# Patient Record
Sex: Female | Born: 1980 | Race: Black or African American | Hispanic: No | Marital: Single | Smoking: Never smoker
Health system: Southern US, Community
[De-identification: ages and names within clinical notes are randomized; demographics above are authoritative.]

## PROBLEM LIST (undated history)

## (undated) DIAGNOSIS — E559 Vitamin D deficiency, unspecified: Secondary | ICD-10-CM

## (undated) DIAGNOSIS — E039 Hypothyroidism, unspecified: Secondary | ICD-10-CM

## (undated) DIAGNOSIS — Z9189 Other specified personal risk factors, not elsewhere classified: Secondary | ICD-10-CM

## (undated) DIAGNOSIS — E119 Type 2 diabetes mellitus without complications: Secondary | ICD-10-CM

## (undated) DIAGNOSIS — Z803 Family history of malignant neoplasm of breast: Secondary | ICD-10-CM

## (undated) DIAGNOSIS — E785 Hyperlipidemia, unspecified: Secondary | ICD-10-CM

## (undated) DIAGNOSIS — Z1371 Encounter for nonprocreative screening for genetic disease carrier status: Secondary | ICD-10-CM

## (undated) HISTORY — DX: Hyperlipidemia, unspecified: E78.5

## (undated) HISTORY — DX: Hypothyroidism, unspecified: E03.9

## (undated) HISTORY — DX: Encounter for nonprocreative screening for genetic disease carrier status: Z13.71

## (undated) HISTORY — PX: WISDOM TOOTH EXTRACTION: SHX21

## (undated) HISTORY — DX: Family history of malignant neoplasm of breast: Z80.3

## (undated) HISTORY — DX: Vitamin D deficiency, unspecified: E55.9

## (undated) HISTORY — DX: Other specified personal risk factors, not elsewhere classified: Z91.89

## (undated) HISTORY — DX: Type 2 diabetes mellitus without complications: E11.9

---

## 2010-02-22 ENCOUNTER — Ambulatory Visit: Payer: Self-pay | Admitting: Internal Medicine

## 2011-08-14 ENCOUNTER — Ambulatory Visit: Payer: Self-pay | Admitting: Internal Medicine

## 2014-10-01 ENCOUNTER — Other Ambulatory Visit: Payer: Self-pay | Admitting: Obstetrics and Gynecology

## 2014-10-01 DIAGNOSIS — Z1231 Encounter for screening mammogram for malignant neoplasm of breast: Secondary | ICD-10-CM

## 2014-10-04 ENCOUNTER — Ambulatory Visit
Admission: RE | Admit: 2014-10-04 | Discharge: 2014-10-04 | Disposition: A | Discharge status: Home / Self Care | Payer: BC Managed Care – PPO | Source: Ambulatory Visit | Attending: Obstetrics and Gynecology | Admitting: Obstetrics and Gynecology

## 2014-10-04 DIAGNOSIS — Z1231 Encounter for screening mammogram for malignant neoplasm of breast: Secondary | ICD-10-CM | POA: Insufficient documentation

## 2014-10-07 ENCOUNTER — Other Ambulatory Visit: Payer: Self-pay | Admitting: Obstetrics and Gynecology

## 2014-10-07 DIAGNOSIS — R928 Other abnormal and inconclusive findings on diagnostic imaging of breast: Secondary | ICD-10-CM

## 2014-10-07 DIAGNOSIS — N6489 Other specified disorders of breast: Secondary | ICD-10-CM

## 2014-10-20 ENCOUNTER — Ambulatory Visit
Admission: RE | Admit: 2014-10-20 | Discharge: 2014-10-20 | Disposition: A | Discharge status: Home / Self Care | Payer: BC Managed Care – PPO | Source: Ambulatory Visit | Attending: Obstetrics and Gynecology | Admitting: Obstetrics and Gynecology

## 2014-10-20 ENCOUNTER — Ambulatory Visit: Payer: BC Managed Care – PPO

## 2014-10-20 DIAGNOSIS — N6082 Other benign mammary dysplasias of left breast: Secondary | ICD-10-CM | POA: Insufficient documentation

## 2014-10-20 DIAGNOSIS — N6489 Other specified disorders of breast: Secondary | ICD-10-CM | POA: Diagnosis present

## 2014-10-20 DIAGNOSIS — R928 Other abnormal and inconclusive findings on diagnostic imaging of breast: Secondary | ICD-10-CM

## 2015-02-13 DIAGNOSIS — Z1371 Encounter for nonprocreative screening for genetic disease carrier status: Secondary | ICD-10-CM

## 2015-02-13 DIAGNOSIS — Z9189 Other specified personal risk factors, not elsewhere classified: Secondary | ICD-10-CM

## 2015-02-13 HISTORY — DX: Other specified personal risk factors, not elsewhere classified: Z91.89

## 2015-02-13 HISTORY — DX: Encounter for nonprocreative screening for genetic disease carrier status: Z13.71

## 2015-09-26 ENCOUNTER — Other Ambulatory Visit: Payer: Self-pay | Admitting: Obstetrics and Gynecology

## 2015-09-26 DIAGNOSIS — Z1231 Encounter for screening mammogram for malignant neoplasm of breast: Secondary | ICD-10-CM

## 2015-10-18 ENCOUNTER — Other Ambulatory Visit: Payer: Self-pay | Admitting: Obstetrics and Gynecology

## 2015-10-18 ENCOUNTER — Ambulatory Visit
Admission: RE | Admit: 2015-10-18 | Discharge: 2015-10-18 | Disposition: A | Discharge status: Home / Self Care | Payer: BC Managed Care – PPO | Source: Ambulatory Visit | Attending: Obstetrics and Gynecology | Admitting: Obstetrics and Gynecology

## 2015-10-18 DIAGNOSIS — R928 Other abnormal and inconclusive findings on diagnostic imaging of breast: Secondary | ICD-10-CM | POA: Diagnosis not present

## 2015-10-18 DIAGNOSIS — Z1231 Encounter for screening mammogram for malignant neoplasm of breast: Secondary | ICD-10-CM | POA: Insufficient documentation

## 2015-10-20 ENCOUNTER — Other Ambulatory Visit: Payer: Self-pay | Admitting: Obstetrics and Gynecology

## 2015-10-20 DIAGNOSIS — N6489 Other specified disorders of breast: Secondary | ICD-10-CM

## 2015-11-15 ENCOUNTER — Ambulatory Visit
Admission: RE | Admit: 2015-11-15 | Discharge: 2015-11-15 | Disposition: A | Discharge status: Home / Self Care | Payer: BC Managed Care – PPO | Source: Ambulatory Visit | Attending: Obstetrics and Gynecology | Admitting: Obstetrics and Gynecology

## 2015-11-15 DIAGNOSIS — N6489 Other specified disorders of breast: Secondary | ICD-10-CM

## 2016-05-08 ENCOUNTER — Other Ambulatory Visit: Payer: Self-pay | Admitting: Obstetrics and Gynecology

## 2016-07-09 ENCOUNTER — Other Ambulatory Visit: Payer: Self-pay | Admitting: Obstetrics and Gynecology

## 2016-07-30 ENCOUNTER — Other Ambulatory Visit: Payer: Self-pay | Admitting: Obstetrics and Gynecology

## 2016-08-03 ENCOUNTER — Other Ambulatory Visit: Payer: Self-pay | Admitting: Obstetrics and Gynecology

## 2016-08-06 MED ORDER — LEVOTHYROXINE SODIUM 100 MCG PO TABS: 100.0000 ug | ORAL_TABLET | Freq: Every day | ORAL | 0 refills | Status: DC

## 2016-08-17 ENCOUNTER — Telehealth: Payer: Self-pay

## 2016-08-17 DIAGNOSIS — E039 Hypothyroidism, unspecified: Secondary | ICD-10-CM

## 2016-08-17 DIAGNOSIS — E119 Type 2 diabetes mellitus without complications: Secondary | ICD-10-CM

## 2016-08-17 NOTE — Telephone Encounter (Signed)
Pt is not able to sched annual until around sometime in Sept but she will need a refill of her levothyroxine before then.  She was wanting to know if labs were still in for her.  Adv they are not in the new system.  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  Adv will send msg to [facility] who [facility]  [phone]

## 2016-08-20 DIAGNOSIS — E039 Hypothyroidism, unspecified: Secondary | ICD-10-CM | POA: Insufficient documentation

## 2016-08-20 DIAGNOSIS — E119 Type 2 diabetes mellitus without complications: Secondary | ICD-10-CM | POA: Insufficient documentation

## 2016-08-20 NOTE — Telephone Encounter (Signed)
Detailed msg left on VM.

## 2016-08-20 NOTE — Telephone Encounter (Signed)
RN to notify pt lab orders are in and I will f/u with results. Will RF levo Rx prn lab results.

## 2016-08-31 ENCOUNTER — Other Ambulatory Visit: Payer: BC Managed Care – PPO

## 2016-08-31 DIAGNOSIS — E039 Hypothyroidism, unspecified: Secondary | ICD-10-CM

## 2016-08-31 DIAGNOSIS — E119 Type 2 diabetes mellitus without complications: Secondary | ICD-10-CM

## 2016-09-01 LAB — COMPREHENSIVE METABOLIC PANEL
A/G RATIO: 1.2 (ref 1.2–2.2)
ALT: 18 IU/L (ref 0–32)
AST: 18 IU/L (ref 0–40)
Albumin: 4.3 g/dL (ref 3.5–5.5)
Alkaline Phosphatase: 66 IU/L (ref 39–117)
BILIRUBIN TOTAL: 0.2 mg/dL (ref 0.0–1.2)
BUN/Creatinine Ratio: 8 — ABNORMAL LOW (ref 9–23)
BUN: 7 mg/dL (ref 6–20)
CALCIUM: 9 mg/dL (ref 8.7–10.2)
CHLORIDE: 100 mmol/L (ref 96–106)
CO2: 22 mmol/L (ref 20–29)
Creatinine, Ser: 0.9 mg/dL (ref 0.57–1.00)
GFR, EST AFRICAN AMERICAN: 95 mL/min/{1.73_m2} (ref >59)
GFR, EST NON AFRICAN AMERICAN: 83 mL/min/{1.73_m2} (ref >59)
GLOBULIN, TOTAL: 3.6 g/dL (ref 1.5–4.5)
Glucose: 98 mg/dL (ref 65–99)
POTASSIUM: 4.2 mmol/L (ref 3.5–5.2)
SODIUM: 138 mmol/L (ref 134–144)
TOTAL PROTEIN: 7.9 g/dL (ref 6.0–8.5)

## 2016-09-01 LAB — HEMOGLOBIN A1C
Est. average glucose Bld gHb Est-mCnc: 137 mg/dL
Hgb A1c MFr Bld: 6.4 % — ABNORMAL HIGH (ref 4.8–5.6)

## 2016-09-01 LAB — TSH+FREE T4
Free T4: 1.53 ng/dL (ref 0.82–1.77)
TSH: 5.17 u[IU]/mL — ABNORMAL HIGH (ref 0.450–4.500)

## 2016-09-03 ENCOUNTER — Telehealth: Payer: Self-pay | Admitting: Obstetrics and Gynecology

## 2016-09-03 DIAGNOSIS — E039 Hypothyroidism, unspecified: Secondary | ICD-10-CM

## 2016-09-03 DIAGNOSIS — E119 Type 2 diabetes mellitus without complications: Secondary | ICD-10-CM

## 2016-09-03 MED ORDER — LISINOPRIL 5 MG PO TABS: 5.0000 mg | ORAL_TABLET | Freq: Every day | ORAL | 0 refills | Status: DC

## 2016-09-03 MED ORDER — LEVOTHYROXINE SODIUM 100 MCG PO TABS: 100.0000 ug | ORAL_TABLET | Freq: Every day | ORAL | 0 refills | Status: DC

## 2016-09-03 MED ORDER — METFORMIN HCL 500 MG PO TABS: 500.0000 mg | ORAL_TABLET | Freq: Two times a day (BID) | ORAL | 0 refills | Status: DC

## 2016-09-03 NOTE — Telephone Encounter (Signed)
Pt aware of labs for type 2 DM and hypothyroidism. Taking metformin 500 mg QD and sometimes BID (as prescribed) for type 2 DM. HgA1C stable compared to 11/17 labs. Rx RF.  Check urine at 9/18 annual. Rx RF lisinopril for kidney protection. Pt taking levo 100 mcg daily but feeling sluggish now. TSH increased from 3.75. Will add 2 tabs on Sundays. Rx RF. Rechk at 9/18 annual.

## 2016-10-25 ENCOUNTER — Other Ambulatory Visit: Payer: BC Managed Care – PPO

## 2016-10-25 DIAGNOSIS — E039 Hypothyroidism, unspecified: Secondary | ICD-10-CM

## 2016-10-25 DIAGNOSIS — E119 Type 2 diabetes mellitus without complications: Secondary | ICD-10-CM

## 2016-10-26 LAB — TSH+FREE T4
FREE T4: 1.81 ng/dL — AB (ref 0.82–1.77)
TSH: 0.557 u[IU]/mL (ref 0.450–4.500)

## 2016-10-26 LAB — MICROALBUMIN / CREATININE URINE RATIO
Creatinine, Urine: 133.9 mg/dL
MICROALBUM., U, RANDOM: 9.3 ug/mL
Microalb/Creat Ratio: 6.9 mg/g creat (ref 0.0–30.0)

## 2016-10-30 ENCOUNTER — Encounter: Payer: Self-pay | Admitting: Obstetrics and Gynecology

## 2016-10-30 ENCOUNTER — Ambulatory Visit (INDEPENDENT_AMBULATORY_CARE_PROVIDER_SITE_OTHER): Payer: BC Managed Care – PPO | Admitting: Obstetrics and Gynecology

## 2016-10-30 VITALS — BP 114/70 | HR 76 | Ht 66.0 in | Wt 192.0 lb

## 2016-10-30 DIAGNOSIS — Z9189 Other specified personal risk factors, not elsewhere classified: Secondary | ICD-10-CM | POA: Insufficient documentation

## 2016-10-30 DIAGNOSIS — Z0000 Encounter for general adult medical examination without abnormal findings: Secondary | ICD-10-CM | POA: Diagnosis not present

## 2016-10-30 DIAGNOSIS — Z01419 Encounter for gynecological examination (general) (routine) without abnormal findings: Secondary | ICD-10-CM

## 2016-10-30 DIAGNOSIS — Z1231 Encounter for screening mammogram for malignant neoplasm of breast: Secondary | ICD-10-CM | POA: Diagnosis not present

## 2016-10-30 DIAGNOSIS — E039 Hypothyroidism, unspecified: Secondary | ICD-10-CM | POA: Diagnosis not present

## 2016-10-30 DIAGNOSIS — Z113 Encounter for screening for infections with a predominantly sexual mode of transmission: Secondary | ICD-10-CM | POA: Diagnosis not present

## 2016-10-30 DIAGNOSIS — Z803 Family history of malignant neoplasm of breast: Secondary | ICD-10-CM

## 2016-10-30 DIAGNOSIS — E119 Type 2 diabetes mellitus without complications: Secondary | ICD-10-CM

## 2016-10-30 DIAGNOSIS — Z1239 Encounter for other screening for malignant neoplasm of breast: Secondary | ICD-10-CM

## 2016-10-30 MED ORDER — LEVOTHYROXINE SODIUM 100 MCG PO TABS: 100.0000 ug | ORAL_TABLET | Freq: Every day | ORAL | 0 refills | Status: DC

## 2016-10-30 MED ORDER — LISINOPRIL 5 MG PO TABS
5.0000 mg | ORAL_TABLET | Freq: Every day | ORAL | 0 refills | Status: DC
Start: 2016-10-30 — End: 2017-02-11

## 2016-10-30 MED ORDER — METFORMIN HCL 500 MG PO TABS: 500.0000 mg | ORAL_TABLET | Freq: Two times a day (BID) | ORAL | 0 refills | Status: DC

## 2016-10-30 NOTE — Progress Notes (Signed)
PCP:  Chad Cordial, PA-C   Chief Complaint  Patient presents with  . Gynecologic Exam     HPI:      [name] is a 36 y.o. No obstetric history on file. who LMP was Patient's last menstrual period was 10/22/2016., presents today for her annual examination.  Her menses are regular every 28-30 days, lasting 4 days.  Dysmenorrhea none. She does not have intermenstrual bleeding.  Sex activity: single partner, contraception - condoms. Declines any other BC. Wants full STD testing--no sx/exposures; just wants to be safe. Will wait for blood STD testing with other labs in 3 months.  Last Pap: 09/08/15  Results were: no abnormalities /neg HPV DNA  Hx of STDs: HPV  Last mammogram: November 15, 2015  Results were: normal--routine follow-up in 12 months There is a FH of breast cancer in her mom. Pt is MyRisk neg 2017. IBIS=29.6% . There is no FH of ovarian cancer. The patient does do self-breast exams. She is taking Vit D supp. She has not had a screening breast MRI.  Tobacco use: The patient denies current or previous tobacco use. Alcohol use: none No drug use.  Exercise: moderately active  She does get adequate calcium and Vitamin D in her diet.  She has a hx of type 2 DM and is on metformin 500 mg BID. HgA1C was 6.4 7/18. She is on lisinopril for kidney protection. Urine creatinine and albumin WNL 10/25/16. Lipids WNL 2017. She sees opthalmology yearly. No side effects from meds.   She has hypothyroidism and was taking levo 100 mcg daily till 7/18 labs. TSH was slighly elevated 7/18 with normal free T4. Due to fatigue, levo increased to 2 tabs on Sun. TSH WNL 10/25/16 but free T4 slightly elevated at 1.81.     Recent Results (from the past 2160 hour(s))  Comprehensive metabolic panel     Status: Abnormal   Collection Time: 08/31/16 11:06 AM  Result Value Ref Range   Glucose 98 65 - 99 mg/dL   BUN 7 6 - 20 mg/dL   Creatinine, Ser 0.90 0.57 - 1.00 mg/dL   GFR calc non Af  Amer 83 >59 mL/min/1.73   GFR calc Af Amer 95 >59 mL/min/1.73   BUN/Creatinine Ratio 8 (L) 9 - 23   Sodium 138 134 - 144 mmol/L   Potassium 4.2 3.5 - 5.2 mmol/L   Chloride 100 96 - 106 mmol/L   CO2 22 20 - 29 mmol/L   Calcium 9.0 8.7 - 10.2 mg/dL   Total Protein 7.9 6.0 - 8.5 g/dL   Albumin 4.3 3.5 - 5.5 g/dL   Globulin, Total 3.6 1.5 - 4.5 g/dL   Albumin/Globulin Ratio 1.2 1.2 - 2.2   Bilirubin Total 0.2 0.0 - 1.2 mg/dL   Alkaline Phosphatase 66 39 - 117 IU/L   AST 18 0 - 40 IU/L   ALT 18 0 - 32 IU/L  TSH + free T4     Status: Abnormal   Collection Time: 08/31/16 11:06 AM  Result Value Ref Range   TSH 5.170 (H) 0.450 - 4.500 uIU/mL   Free T4 1.53 0.82 - 1.77 ng/dL  Hemoglobin A1c     Status: Abnormal   Collection Time: 08/31/16 11:06 AM  Result Value Ref Range   Hgb A1c MFr Bld 6.4 (H) 4.8 - 5.6 %    Comment:          Pre-diabetes: 5.7 - 6.4  Diabetes: >6.4          Glycemic control for adults with diabetes: <7.0    Est. average glucose Bld gHb Est-mCnc 137 mg/dL  Urine Microalbumin w/creat. ratio     Status: None   Collection Time: 10/25/16  2:54 PM  Result Value Ref Range   Creatinine, Urine 133.9 Not Estab. mg/dL   Albumin, Urine 9.3 Not Estab. ug/mL   Microalb/Creat Ratio 6.9 0.0 - 30.0 mg/g creat  TSH + free T4     Status: Abnormal   Collection Time: 10/25/16  2:54 PM  Result Value Ref Range   TSH 0.557 0.450 - 4.500 uIU/mL   Free T4 1.81 (H) 0.82 - 1.77 ng/dL     Past Medical History:  Diagnosis Date  . BRCA negative 2017   MyRisk neg  . Family history of breast cancer   . Hypothyroid   . Increased risk of breast cancer 2017   IBIS=29.6%  . Type 2 diabetes mellitus ([place])   . Vitamin D deficiency     History reviewed. No pertinent surgical history.  Family History  Problem Relation Age of Onset  . Breast cancer Mother 74  . Kidney cancer Mother 40  . Diabetes type II Father   . Ovarian cancer Neg Hx   . Colon cancer Neg Hx     Social  History   Social History  . Marital status: Single    Spouse name: N/A  . Number of children: N/A  . Years of education: N/A   Occupational History  . Not on file.   Social History Main Topics  . Smoking status: Never Smoker  . Smokeless tobacco: Never Used  . Alcohol use No  . Drug use: No  . Sexual activity: Yes    Partners: Male    Birth control/ protection: None   Other Topics Concern  . Not on file   Social History Narrative  . No narrative on file    Current Meds  Medication Sig  . levothyroxine (SYNTHROID, LEVOTHROID) 100 MCG tablet Take 1 tablet (100 mcg total) by mouth daily. Except 2 tabs on Sundays  . lisinopril (PRINIVIL,ZESTRIL) 5 MG tablet Take 1 tablet (5 mg total) by mouth daily.  . metFORMIN (GLUCOPHAGE) 500 MG tablet Take 1 tablet (500 mg total) by mouth 2 (two) times daily with a meal.  . [DISCONTINUED] levothyroxine (SYNTHROID, LEVOTHROID) 100 MCG tablet Take 1 tablet (100 mcg total) by mouth daily. Except 2 tabs on Sundays  . [DISCONTINUED] lisinopril (PRINIVIL,ZESTRIL) 5 MG tablet Take 1 tablet (5 mg total) by mouth daily.  . [DISCONTINUED] metFORMIN (GLUCOPHAGE) 500 MG tablet Take 1 tablet (500 mg total) by mouth 2 (two) times daily with a meal.     ROS:  Review of Systems  Constitutional: Negative for fatigue, fever and unexpected weight change.  Respiratory: Negative for cough, shortness of breath and wheezing.   Cardiovascular: Negative for chest pain, palpitations and leg swelling.  Gastrointestinal: Negative for blood in stool, constipation, diarrhea, nausea and vomiting.  Endocrine: Negative for cold intolerance, heat intolerance and polyuria.  Genitourinary: Negative for dyspareunia, dysuria, flank pain, frequency, genital sores, hematuria, menstrual problem, pelvic pain, urgency, vaginal bleeding, vaginal discharge and vaginal pain.  Musculoskeletal: Positive for arthralgias. Negative for back pain, joint swelling and myalgias.  Skin:  Negative for rash.  Neurological: Negative for dizziness, syncope, light-headedness, numbness and headaches.  Hematological: Negative for adenopathy.  Psychiatric/Behavioral: Negative for agitation, confusion, sleep disturbance and suicidal ideas. The patient  is not nervous/anxious.      Objective: BP 114/70 (BP Location: Left Arm, Patient Position: Sitting, Cuff Size: Normal)   Pulse 76   Ht _0  (1.676 m)   Wt 192 lb (87.1 kg)   LMP 10/22/2016   BMI 30.99 kg/m    Physical Exam  Constitutional: She is oriented to person, place, and time. She appears well-developed and well-nourished.  Genitourinary: Vagina normal and uterus normal. There is no rash or tenderness on the right labia. There is no rash or tenderness on the left labia. No erythema or tenderness in the vagina. No vaginal discharge found. Right adnexum does not display mass and does not display tenderness. Left adnexum does not display mass and does not display tenderness. Cervix does not exhibit motion tenderness or polyp. Uterus is not enlarged or tender.  Neck: Normal range of motion. No thyromegaly present.  Cardiovascular: Normal rate, regular rhythm and normal heart sounds.   No murmur heard. Pulmonary/Chest: Effort normal and breath sounds normal. Right breast exhibits no mass, no nipple discharge, no skin change and no tenderness. Left breast exhibits no mass, no nipple discharge, no skin change and no tenderness.  Abdominal: Soft. There is no tenderness. There is no guarding.  Musculoskeletal: Normal range of motion.  Neurological: She is alert and oriented to person, place, and time. No cranial nerve deficit.  Psychiatric: She has a normal mood and affect. Her behavior is normal.  Vitals reviewed.   Assessment/Plan: Encounter for annual routine gynecological examination  Screening for STD (sexually transmitted disease) - Nuswab today. Blood tests with next lab draw in 3 months. - Plan: HIV antibody, RPR, HSV 2  antibody, IgG, Hepatitis C antibody, Chlamydia/Gonococcus/Trichomonas, NAA  Screening for breast cancer - Pt to sched mammo. - Plan: MM DIGITAL SCREENING BILATERAL  Family history of breast cancer  Increased risk of breast cancer - MyRisk neg 2017; IBIS=29.6%. Cont monthly SBE, Q6 mo CBE, yearly mammos and scr breast MRI. Pt to RTO in 6 mo. Can sched MRI then if pt interested.  Blood tests for routine general physical examination - Plan: Comprehensive metabolic panel, Lipid panel, TSH + free T4, Hemoglobin A1c  Type 2 diabetes mellitus without complication, without long-term current use of insulin ([facility]) - Rx RF metformin and lisinopril. Cont diet/exercise. REchk labs in 3 months. Seeing opthalmology yearly. - Plan: Comprehensive metabolic panel, Lipid panel, Hemoglobin A1c, metFORMIN (GLUCOPHAGE) 500 MG tablet, lisinopril (PRINIVIL,ZESTRIL) 5 MG tablet  Acquired hypothyroidism - Cont levo 100 mcg daily with 2 tabs on Sun. Rechk labs in 3 months. - Plan: levothyroxine (SYNTHROID, LEVOTHROID) 100 MCG tablet   Meds ordered this encounter  Medications  . metFORMIN (GLUCOPHAGE) 500 MG tablet    Sig: Take 1 tablet (500 mg total) by mouth 2 (two) times daily with a meal.    Dispense:  180 tablet    Refill:  0  . lisinopril (PRINIVIL,ZESTRIL) 5 MG tablet    Sig: Take 1 tablet (5 mg total) by mouth daily.    Dispense:  90 tablet    Refill:  0  . levothyroxine (SYNTHROID, LEVOTHROID) 100 MCG tablet    Sig: Take 1 tablet (100 mcg total) by mouth daily. Except 2 tabs on Sundays    Dispense:  96 tablet    Refill:  0             GYN counsel breast self exam, mammography screening, adequate intake of calcium and vitamin D, diet and exercise  F/U  Return in about 1 year (around 10/30/2017).  Marcina Kinnison B. Aydon Swamy, PA-C 10/31/2016 12:00 PM

## 2016-11-02 LAB — CHLAMYDIA/GONOCOCCUS/TRICHOMONAS, NAA
Chlamydia by NAA: NEGATIVE
Gonococcus by NAA: NEGATIVE
Trich vag by NAA: NEGATIVE

## 2016-11-18 ENCOUNTER — Other Ambulatory Visit: Payer: Self-pay | Admitting: Obstetrics and Gynecology

## 2016-11-18 DIAGNOSIS — E039 Hypothyroidism, unspecified: Secondary | ICD-10-CM

## 2017-02-06 ENCOUNTER — Other Ambulatory Visit: Payer: BC Managed Care – PPO

## 2017-02-06 DIAGNOSIS — Z113 Encounter for screening for infections with a predominantly sexual mode of transmission: Secondary | ICD-10-CM

## 2017-02-06 DIAGNOSIS — E119 Type 2 diabetes mellitus without complications: Secondary | ICD-10-CM

## 2017-02-06 DIAGNOSIS — Z0000 Encounter for general adult medical examination without abnormal findings: Secondary | ICD-10-CM

## 2017-02-07 ENCOUNTER — Telehealth: Payer: Self-pay | Admitting: Obstetrics and Gynecology

## 2017-02-07 ENCOUNTER — Other Ambulatory Visit: Payer: BC Managed Care – PPO

## 2017-02-07 NOTE — Telephone Encounter (Signed)
Pt is calling needing labs. Please advise no order in place.

## 2017-02-07 NOTE — Telephone Encounter (Signed)
Lab orders in per Ova FreshwaterJennifer Wilson. Pt sched for lab appt.

## 2017-02-08 ENCOUNTER — Other Ambulatory Visit: Payer: BC Managed Care – PPO

## 2017-02-09 LAB — LIPID PANEL
CHOLESTEROL TOTAL: 159 mg/dL (ref 100–199)
Chol/HDL Ratio: 3.2 ratio (ref 0.0–4.4)
HDL: 50 mg/dL (ref >39)
LDL Calculated: 86 mg/dL (ref 0–99)
TRIGLYCERIDES: 117 mg/dL (ref 0–149)
VLDL Cholesterol Cal: 23 mg/dL (ref 5–40)

## 2017-02-09 LAB — COMPREHENSIVE METABOLIC PANEL
A/G RATIO: 1.3 (ref 1.2–2.2)
ALK PHOS: 67 IU/L (ref 39–117)
ALT: 21 IU/L (ref 0–32)
AST: 17 IU/L (ref 0–40)
Albumin: 4.3 g/dL (ref 3.5–5.5)
BUN/Creatinine Ratio: 8 — ABNORMAL LOW (ref 9–23)
BUN: 7 mg/dL (ref 6–20)
Bilirubin Total: <0.2 mg/dL (ref 0.0–1.2)
CO2: 23 mmol/L (ref 20–29)
CREATININE: 0.93 mg/dL (ref 0.57–1.00)
Calcium: 8.8 mg/dL (ref 8.7–10.2)
Chloride: 100 mmol/L (ref 96–106)
GFR calc Af Amer: 91 mL/min/{1.73_m2} (ref >59)
GFR calc non Af Amer: 79 mL/min/{1.73_m2} (ref >59)
GLOBULIN, TOTAL: 3.4 g/dL (ref 1.5–4.5)
Glucose: 110 mg/dL — ABNORMAL HIGH (ref 65–99)
POTASSIUM: 4.2 mmol/L (ref 3.5–5.2)
SODIUM: 142 mmol/L (ref 134–144)
Total Protein: 7.7 g/dL (ref 6.0–8.5)

## 2017-02-09 LAB — TSH+FREE T4
Free T4: 1.73 ng/dL (ref 0.82–1.77)
TSH: 0.744 u[IU]/mL (ref 0.450–4.500)

## 2017-02-09 LAB — RPR: RPR: NONREACTIVE

## 2017-02-09 LAB — HEPATITIS C ANTIBODY: Hep C Virus Ab: <0.1 s/co ratio (ref 0.0–0.9)

## 2017-02-09 LAB — HEMOGLOBIN A1C
ESTIMATED AVERAGE GLUCOSE: 146 mg/dL
HEMOGLOBIN A1C: 6.7 % — AB (ref 4.8–5.6)

## 2017-02-09 LAB — HSV 2 ANTIBODY, IGG

## 2017-02-09 LAB — HIV ANTIBODY (ROUTINE TESTING W REFLEX): HIV SCREEN 4TH GENERATION: NONREACTIVE

## 2017-02-11 ENCOUNTER — Telehealth: Payer: Self-pay | Admitting: Obstetrics and Gynecology

## 2017-02-11 DIAGNOSIS — E119 Type 2 diabetes mellitus without complications: Secondary | ICD-10-CM

## 2017-02-11 DIAGNOSIS — E039 Hypothyroidism, unspecified: Secondary | ICD-10-CM

## 2017-02-11 MED ORDER — LEVOTHYROXINE SODIUM 100 MCG PO TABS: ORAL_TABLET | ORAL | 1 refills | Status: DC

## 2017-02-11 MED ORDER — LISINOPRIL 5 MG PO TABS: 5.0000 mg | ORAL_TABLET | Freq: Every day | ORAL | 1 refills | Status: DC

## 2017-02-11 MED ORDER — METFORMIN HCL 500 MG PO TABS: 500.0000 mg | ORAL_TABLET | Freq: Two times a day (BID) | ORAL | 1 refills | Status: DC

## 2017-02-11 NOTE — Telephone Encounter (Signed)
Pt aware of lab results. Lipids WNL. CMP WNL except fasting glucose. Cont lisinopril for kidney protection.  HgA1C increased to 6.7 from 6.4. Pt taking metformin 500 mg BID but not faithfully taking it BID (usually QD). Recommended better med compliance and diet/wt loss/exercise. Rechk in 6 months. Thyroid WNL. Pt feeling good with current dose of levo 100  Mcg daily QD except 2 tabs on Sun. Rx RF. Rechk in 6 months.

## 2017-02-26 ENCOUNTER — Ambulatory Visit
Admission: RE | Admit: 2017-02-26 | Discharge: 2017-02-26 | Disposition: A | Discharge status: Home / Self Care | Payer: BC Managed Care – PPO | Source: Ambulatory Visit | Attending: Obstetrics and Gynecology | Admitting: Obstetrics and Gynecology

## 2017-02-26 DIAGNOSIS — Z1231 Encounter for screening mammogram for malignant neoplasm of breast: Secondary | ICD-10-CM | POA: Insufficient documentation

## 2017-02-26 DIAGNOSIS — Z1239 Encounter for other screening for malignant neoplasm of breast: Secondary | ICD-10-CM

## 2017-02-27 ENCOUNTER — Encounter: Payer: Self-pay | Admitting: Obstetrics and Gynecology

## 2017-07-29 ENCOUNTER — Other Ambulatory Visit: Payer: BC Managed Care – PPO

## 2017-07-29 DIAGNOSIS — E039 Hypothyroidism, unspecified: Secondary | ICD-10-CM

## 2017-07-29 DIAGNOSIS — E119 Type 2 diabetes mellitus without complications: Secondary | ICD-10-CM

## 2017-07-30 ENCOUNTER — Encounter: Payer: Self-pay | Admitting: Obstetrics and Gynecology

## 2017-07-30 DIAGNOSIS — E039 Hypothyroidism, unspecified: Secondary | ICD-10-CM

## 2017-07-30 DIAGNOSIS — E119 Type 2 diabetes mellitus without complications: Secondary | ICD-10-CM

## 2017-07-30 LAB — COMPREHENSIVE METABOLIC PANEL
ALT: 20 IU/L (ref 0–32)
AST: 12 IU/L (ref 0–40)
Albumin/Globulin Ratio: 1.2 (ref 1.2–2.2)
Albumin: 4.2 g/dL (ref 3.5–5.5)
Alkaline Phosphatase: 63 IU/L (ref 39–117)
BUN/Creatinine Ratio: 10 (ref 9–23)
BUN: 9 mg/dL (ref 6–20)
Bilirubin Total: <0.2 mg/dL (ref 0.0–1.2)
CALCIUM: 8.8 mg/dL (ref 8.7–10.2)
CO2: 23 mmol/L (ref 20–29)
Chloride: 103 mmol/L (ref 96–106)
Creatinine, Ser: 0.94 mg/dL (ref 0.57–1.00)
GFR, EST AFRICAN AMERICAN: 90 mL/min/{1.73_m2} (ref >59)
GFR, EST NON AFRICAN AMERICAN: 78 mL/min/{1.73_m2} (ref >59)
GLUCOSE: 104 mg/dL — AB (ref 65–99)
Globulin, Total: 3.4 g/dL (ref 1.5–4.5)
Potassium: 4.3 mmol/L (ref 3.5–5.2)
Sodium: 139 mmol/L (ref 134–144)
TOTAL PROTEIN: 7.6 g/dL (ref 6.0–8.5)

## 2017-07-30 LAB — HEMOGLOBIN A1C
ESTIMATED AVERAGE GLUCOSE: 137 mg/dL
HEMOGLOBIN A1C: 6.4 % — AB (ref 4.8–5.6)

## 2017-07-30 LAB — TSH+FREE T4
FREE T4: 1.54 ng/dL (ref 0.82–1.77)
TSH: 4.32 u[IU]/mL (ref 0.450–4.500)

## 2017-07-30 MED ORDER — LEVOTHYROXINE SODIUM 100 MCG PO TABS: ORAL_TABLET | ORAL | 1 refills | Status: DC

## 2017-07-30 MED ORDER — LISINOPRIL 5 MG PO TABS: 5.0000 mg | ORAL_TABLET | Freq: Every day | ORAL | 1 refills | Status: DC

## 2017-07-30 MED ORDER — METFORMIN HCL 500 MG PO TABS: 500.0000 mg | ORAL_TABLET | Freq: Two times a day (BID) | ORAL | 1 refills | Status: DC

## 2017-07-30 NOTE — Telephone Encounter (Signed)
Beth Sanders with results of labs. HvA1C improved to 6.4 from 6.7. Cont metformin 500 mg BID. TSH is normal but significantly increased from 6 months ago. Asked pt to call me for thyroid f/u. Rechk labs in 6 months.   Meds ordered this encounter  Medications  . lisinopril (PRINIVIL,ZESTRIL) 5 MG tablet    Sig: Take 1 tablet (5 mg total) by mouth daily.    Dispense:  90 tablet    Refill:  1    Order Specific Question:   Supervising Provider    Answer:   Nadara MustardHARRIS, ROBERT P [id number]]  . metFORMIN (GLUCOPHAGE) 500 MG tablet    Sig: Take 1 tablet (500 mg total) by mouth 2 (two) times daily with a meal.    Dispense:  180 tablet    Refill:  1    Order Specific Question:   Supervising Provider    Answer:   Nadara MustardHARRIS, ROBERT P [id number]]  . levothyroxine (SYNTHROID, LEVOTHROID) 100 MCG tablet    Sig: TAKE 1 TABLET BY MOUTH ONCE DAILY (EXCEPT  FOR  SUNDAYS  TAKE  2  TABLETS  BY  MOUTH)    Dispense:  90 tablet    Refill:  1    Please consider 90 day supplies to promote better adherence    Order Specific Question:   Supervising Provider    Answer:   Nadara MustardHARRIS, ROBERT P [[id number]]    Recent Results (from the past 2160 hour(s))  Comprehensive metabolic panel     Status: Abnormal   Collection Time: 07/29/17 10:55 AM  Result Value Ref Range   Glucose 104 (H) 65 - 99 mg/dL   BUN 9 6 - 20 mg/dL   Creatinine, Ser 1.190.94 0.57 - 1.00 mg/dL   GFR calc non Af Amer 78 >59 mL/min/1.73   GFR calc Af Amer 90 >59 mL/min/1.73   BUN/Creatinine Ratio 10 9 - 23   Sodium 139 134 - 144 mmol/L   Potassium 4.3 3.5 - 5.2 mmol/L   Chloride 103 96 - 106 mmol/L   CO2 23 20 - 29 mmol/L   Calcium 8.8 8.7 - 10.2 mg/dL   Total Protein 7.6 6.0 - 8.5 g/dL   Albumin 4.2 3.5 - 5.5 g/dL   Globulin, Total 3.4 1.5 - 4.5 g/dL   Albumin/Globulin Ratio 1.2 1.2 - 2.2   Bilirubin Total <0.2 0.0 - 1.2 mg/dL   Alkaline Phosphatase 63 39 - 117 IU/L   AST 12 0 - 40 IU/L   ALT 20 0 - 32 IU/L  Hemoglobin A1c     Status: Abnormal   Collection Time:  07/29/17 10:55 AM  Result Value Ref Range   Hgb A1c MFr Bld 6.4 (H) 4.8 - 5.6 %    Comment:          Prediabetes: 5.7 - 6.4          Diabetes: >6.4          Glycemic control for adults with diabetes: <7.0    Est. average glucose Bld gHb Est-mCnc 137 mg/dL  TSH + free T4     Status: None   Collection Time: 07/29/17 10:55 AM  Result Value Ref Range   TSH 4.320 0.450 - 4.500 uIU/mL   Free T4 1.54 0.82 - 1.77 ng/dL

## 2017-08-02 ENCOUNTER — Telehealth: Payer: Self-pay | Admitting: Obstetrics and Gynecology

## 2017-08-02 NOTE — Telephone Encounter (Signed)
Done

## 2017-08-02 NOTE — Telephone Encounter (Signed)
Patient is calling for results. Please advise  °

## 2017-09-13 ENCOUNTER — Encounter: Payer: Self-pay | Admitting: Obstetrics and Gynecology

## 2017-09-13 DIAGNOSIS — Z111 Encounter for screening for respiratory tuberculosis: Secondary | ICD-10-CM

## 2017-09-23 ENCOUNTER — Encounter: Payer: Self-pay | Admitting: Obstetrics and Gynecology

## 2017-09-26 NOTE — Telephone Encounter (Signed)
Pt needs TB testing for work health form. RTO for lab.

## 2017-09-27 ENCOUNTER — Other Ambulatory Visit: Payer: BC Managed Care – PPO

## 2017-09-27 DIAGNOSIS — Z111 Encounter for screening for respiratory tuberculosis: Secondary | ICD-10-CM

## 2017-10-05 LAB — QUANTIFERON-TB GOLD PLUS
QUANTIFERON TB1 AG VALUE: 0.04 [IU]/mL
QUANTIFERON TB2 AG VALUE: 0.02 [IU]/mL
QuantiFERON Mitogen Value: >10 IU/mL
QuantiFERON Nil Value: 0.01 IU/mL
QuantiFERON-TB Gold Plus: NEGATIVE

## 2017-10-07 ENCOUNTER — Telehealth: Payer: Self-pay

## 2017-10-07 NOTE — Telephone Encounter (Signed)
Patient is Aware

## 2017-10-31 ENCOUNTER — Ambulatory Visit: Payer: BC Managed Care – PPO | Admitting: Obstetrics and Gynecology

## 2017-11-05 ENCOUNTER — Ambulatory Visit: Payer: BC Managed Care – PPO | Admitting: Obstetrics and Gynecology

## 2017-11-18 ENCOUNTER — Ambulatory Visit (INDEPENDENT_AMBULATORY_CARE_PROVIDER_SITE_OTHER): Payer: BC Managed Care – PPO | Admitting: Obstetrics and Gynecology

## 2017-11-18 ENCOUNTER — Encounter: Payer: Self-pay | Admitting: Obstetrics and Gynecology

## 2017-11-18 VITALS — BP 98/60 | HR 92 | Ht 66.0 in | Wt 196.0 lb

## 2017-11-18 DIAGNOSIS — Z0000 Encounter for general adult medical examination without abnormal findings: Secondary | ICD-10-CM | POA: Diagnosis not present

## 2017-11-18 DIAGNOSIS — Z9189 Other specified personal risk factors, not elsewhere classified: Secondary | ICD-10-CM

## 2017-11-18 DIAGNOSIS — Z01419 Encounter for gynecological examination (general) (routine) without abnormal findings: Secondary | ICD-10-CM | POA: Diagnosis not present

## 2017-11-18 DIAGNOSIS — E039 Hypothyroidism, unspecified: Secondary | ICD-10-CM

## 2017-11-18 DIAGNOSIS — Z1239 Encounter for other screening for malignant neoplasm of breast: Secondary | ICD-10-CM | POA: Diagnosis not present

## 2017-11-18 DIAGNOSIS — E119 Type 2 diabetes mellitus without complications: Secondary | ICD-10-CM

## 2017-11-18 NOTE — Patient Instructions (Signed)
I value your feedback and entrusting [place] with your care. If you get a [facility] patient survey, I would appreciate you taking the time to let us know about your experience today. Thank you!  [facility]: [phone]     LABS DUE 12/19.

## 2017-11-18 NOTE — Progress Notes (Signed)
PCP:  Chad Cordial, PA-C   Chief Complaint  Patient presents with  . Gynecologic Exam     HPI:      Beth Sanders is a 37 y.o. No obstetric history on file. who LMP was Patient's last menstrual period was 11/09/2017 (exact date)., presents today for her annual examination.  Her menses are regular every 28-30 days, lasting 4 days.  Dysmenorrhea none. She does not have intermenstrual bleeding.  Sex activity: single partner, contraception - condoms. Declines any other BC.  Last Pap: 09/08/15  Results were: no abnormalities /neg HPV DNA  Hx of STDs: HPV  Last mammogram: 02/26/17 Results were: normal--routine follow-up in 12 months There is a FH of breast cancer in her mom. Pt is MyRisk neg 2017. IBIS=29.6% . There is no FH of ovarian cancer. The patient does do self-breast exams. She stopped taking Vit D supp. She has not had a screening breast MRI.  Tobacco use: The patient denies current or previous tobacco use. Alcohol use: none No drug use.  Exercise: not active  She does get adequate calcium but not Vitamin D in her diet.  She has a hx of type 2 DM and is on metformin 500 mg BID. HgA1C was 6.4 6/19. She is on lisinopril for kidney protection. Urine creatinine and albumin WNL 10/25/16. Lipids WNL 12/18. She sees opthalmology yearly. No side effects from meds.   She has hypothyroidism and is taking levo 100 mcg daily with 2 tabs Sun. TSH WNL 6/19.  Due for all labs to be repeated 12/19. Has Rx RF till then.  Pt notes joint pain in feet, ankles and knees intermittently. Sx come and go. Usually LT side more than RT. Is not exercising. Sx improve if she wears orthotics.    Past Medical History:  Diagnosis Date  . BRCA negative 2017   MyRisk neg  . Family history of breast cancer   . Hypothyroid   . Increased risk of breast cancer 2017   IBIS=29.6%  . Type 2 diabetes mellitus ([place])   . Vitamin D deficiency     Past Surgical History:  Procedure Laterality Date    . WISDOM TOOTH EXTRACTION      Family History  Problem Relation Age of Onset  . Breast cancer Mother 61  . Kidney cancer Mother 80  . Diabetes type II Father   . Ovarian cancer Maternal Aunt   . Colon cancer Neg Hx     Social History   Socioeconomic History  . Marital status: Single    Spouse name: Not on file  . Number of children: Not on file  . Years of education: Not on file  . Highest education level: Not on file  Occupational History  . Not on file  Social Needs  . Financial resource strain: Not on file  . Food insecurity:    Worry: Not on file    Inability: Not on file  . Transportation needs:    Medical: Not on file    Non-medical: Not on file  Tobacco Use  . Smoking status: Never Smoker  . Smokeless tobacco: Never Used  Substance and Sexual Activity  . Alcohol use: Yes    Comment: socially  . Drug use: No  . Sexual activity: Yes    Partners: Male    Birth control/protection: Condom  Lifestyle  . Physical activity:    Days per week: Not on file    Minutes per session: Not on file  .  Stress: Not on file  Relationships  . Social connections:    Talks on phone: Not on file    Gets together: Not on file    Attends religious service: Not on file    Active member of club or organization: Not on file    Attends meetings of clubs or organizations: Not on file    Relationship status: Not on file  . Intimate partner violence:    Fear of current or ex partner: Not on file    Emotionally abused: Not on file    Physically abused: Not on file    Forced sexual activity: Not on file  Other Topics Concern  . Not on file  Social History Narrative  . Not on file    Current Meds  Medication Sig  . levothyroxine (SYNTHROID, LEVOTHROID) 100 MCG tablet TAKE 1 TABLET BY MOUTH ONCE DAILY (EXCEPT  FOR  SUNDAYS  TAKE  2  TABLETS  BY  MOUTH)  . lisinopril (PRINIVIL,ZESTRIL) 5 MG tablet Take 1 tablet (5 mg total) by mouth daily.  . metFORMIN (GLUCOPHAGE) 500 MG tablet  Take 1 tablet (500 mg total) by mouth 2 (two) times daily with a meal.     ROS:  Review of Systems  Constitutional: Negative for fatigue, fever and unexpected weight change.  Respiratory: Negative for cough, shortness of breath and wheezing.   Cardiovascular: Negative for chest pain, palpitations and leg swelling.  Gastrointestinal: Negative for blood in stool, constipation, diarrhea, nausea and vomiting.  Endocrine: Negative for cold intolerance, heat intolerance and polyuria.  Genitourinary: Negative for dyspareunia, dysuria, flank pain, frequency, genital sores, hematuria, menstrual problem, pelvic pain, urgency, vaginal bleeding, vaginal discharge and vaginal pain.  Musculoskeletal: Positive for arthralgias. Negative for back pain, joint swelling and myalgias.  Skin: Negative for rash.  Neurological: Negative for dizziness, syncope, light-headedness, numbness and headaches.  Hematological: Negative for adenopathy.  Psychiatric/Behavioral: Negative for agitation, confusion, sleep disturbance and suicidal ideas. The patient is not nervous/anxious.      Objective: BP 98/60   Pulse 92   Ht '5\' 6"'  (1.676 m)   Wt 196 lb (88.9 kg)   LMP 11/09/2017 (Exact Date)   BMI 31.64 kg/m    Physical Exam  Constitutional: She is oriented to person, place, and time. She appears well-developed and well-nourished.  Genitourinary: Vagina normal and uterus normal. There is no rash or tenderness on the right labia. There is no rash or tenderness on the left labia. No erythema or tenderness in the vagina. No vaginal discharge found. Right adnexum does not display mass and does not display tenderness. Left adnexum does not display mass and does not display tenderness. Cervix does not exhibit motion tenderness or polyp. Uterus is not enlarged or tender.  Neck: Normal range of motion. No thyromegaly present.  Cardiovascular: Normal rate, regular rhythm and normal heart sounds.  No murmur  heard. Pulmonary/Chest: Effort normal and breath sounds normal. Right breast exhibits no mass, no nipple discharge, no skin change and no tenderness. Left breast exhibits no mass, no nipple discharge, no skin change and no tenderness.  Abdominal: Soft. There is no tenderness. There is no guarding.  Musculoskeletal: Normal range of motion.  Neurological: She is alert and oriented to person, place, and time. No cranial nerve deficit.  Psychiatric: She has a normal mood and affect. Her behavior is normal.  Vitals reviewed.   Assessment/Plan: Encounter for annual routine gynecological examination  Screening for breast cancer - Pt to sched mammo 1/20 -  Plan: MM 3D SCREEN BREAST BILATERAL  Increased risk of breast cancer - IBIS=29.6%. Pt doing monthly SBE, yearly CBE and mammos. Discussed scr breast MRI--to call 5/20 to sched if desires. Resume Vit D. - Plan: MM 3D SCREEN BREAST BILATERAL  Blood tests for routine general physical examination - Plan: Comprehensive metabolic panel, Lipid panel, Hemoglobin A1c, TSH + free T4, Microalbumin / creatinine urine ratio  Type 2 diabetes mellitus without complication, without long-term current use of insulin ([place]) - Labs due 12/19. Increase exercise/diet changes.  - Plan: Comprehensive metabolic panel, Lipid panel, Hemoglobin A1c, Microalbumin / creatinine urine ratio  Acquired hypothyroidism - Labs due 12/19.  - Plan: TSH + free T4  Has Rx RF till 12/19 labs.         GYN counsel breast self exam, mammography screening, adequate intake of calcium and vitamin D, diet and exercise     F/U  Return in about 1 year (around 11/19/2018).  Alicia B. Copland, PA-C 11/18/2017 2:09 PM

## 2017-11-23 ENCOUNTER — Encounter: Payer: Self-pay | Admitting: Podiatry

## 2017-11-23 ENCOUNTER — Ambulatory Visit (INDEPENDENT_AMBULATORY_CARE_PROVIDER_SITE_OTHER): Payer: BC Managed Care – PPO

## 2017-11-23 ENCOUNTER — Ambulatory Visit: Payer: BC Managed Care – PPO | Admitting: Podiatry

## 2017-11-23 VITALS — BP 140/86 | HR 78

## 2017-11-23 DIAGNOSIS — M7752 Other enthesopathy of left foot: Secondary | ICD-10-CM | POA: Diagnosis not present

## 2017-11-23 DIAGNOSIS — M25572 Pain in left ankle and joints of left foot: Secondary | ICD-10-CM

## 2017-11-23 DIAGNOSIS — M778 Other enthesopathies, not elsewhere classified: Secondary | ICD-10-CM

## 2017-11-23 DIAGNOSIS — M779 Enthesopathy, unspecified: Secondary | ICD-10-CM

## 2017-11-23 DIAGNOSIS — M19071 Primary osteoarthritis, right ankle and foot: Secondary | ICD-10-CM

## 2017-11-23 DIAGNOSIS — M7751 Other enthesopathy of right foot: Secondary | ICD-10-CM | POA: Diagnosis not present

## 2017-11-23 MED ORDER — MELOXICAM 15 MG PO TABS: 15.0000 mg | ORAL_TABLET | Freq: Every day | ORAL | 0 refills | Status: DC

## 2017-12-23 ENCOUNTER — Ambulatory Visit: Payer: BC Managed Care – PPO | Admitting: Orthotics

## 2017-12-23 DIAGNOSIS — M778 Other enthesopathies, not elsewhere classified: Secondary | ICD-10-CM

## 2017-12-23 DIAGNOSIS — M779 Enthesopathy, unspecified: Principal | ICD-10-CM

## 2017-12-23 NOTE — Progress Notes (Signed)
Patient came in today to pick up custom made foot orthotics.  The goals were accomplished and the patient reported no dissatisfaction with said orthotics.  Patient was advised of breakin period and how to report any issues. 

## 2018-01-01 NOTE — Progress Notes (Signed)
  Subjective:  Patient ID: Beth Sanders, female    DOB: 02-06-1981,  MRN: [id number]  Chief Complaint  Patient presents with  . Foot Pain    left foot pain    37 y.o. female presents with the above complaint.  Reports left foot pain is on off for several years reports pain in the top of the foot and on the side and the big toe reports a history of gout that has been confirmed by drawing blood.   Review of Systems: Negative except as noted in the HPI. Denies N/V/F/Ch.  Past Medical History:  Diagnosis Date  . BRCA negative 2017   MyRisk neg  . Family history of breast cancer   . Hypothyroid   . Increased risk of breast cancer 2017   IBIS=29.6%  . Type 2 diabetes mellitus ([place])   . Vitamin D deficiency     Current Outpatient Medications:  .  levothyroxine (SYNTHROID, LEVOTHROID) 100 MCG tablet, TAKE 1 TABLET BY MOUTH ONCE DAILY (EXCEPT  FOR  SUNDAYS  TAKE  2  TABLETS  BY  MOUTH), Disp: 90 tablet, Rfl: 1 .  lisinopril (PRINIVIL,ZESTRIL) 5 MG tablet, Take 1 tablet (5 mg total) by mouth daily., Disp: 90 tablet, Rfl: 1 .  meloxicam (MOBIC) 15 MG tablet, Take 1 tablet (15 mg total) by mouth daily., Disp: 30 tablet, Rfl: 0 .  metFORMIN (GLUCOPHAGE) 500 MG tablet, Take 1 tablet (500 mg total) by mouth 2 (two) times daily with a meal., Disp: 180 tablet, Rfl: 1  Social History   Tobacco Use  Smoking Status Never Smoker  Smokeless Tobacco Never Used    No Known Allergies Objective:   Vitals:   11/23/17 1029  BP: 140/86  Pulse: 78   There is no height or weight on file to calculate BMI. Constitutional Well developed. Well nourished.  Vascular Dorsalis pedis pulses palpable bilaterally. Posterior tibial pulses palpable bilaterally. Capillary refill normal to all digits.  No cyanosis or clubbing noted. Pedal hair growth normal.  Neurologic Normal speech. Oriented to person, place, and time. Epicritic sensation to light touch grossly present bilaterally.  Dermatologic  Nails well groomed and normal in appearance. No open wounds. No skin lesions.  Orthopedic: Normal joint ROM without pain or crepitus bilaterally. No visible deformities. Pain palpation of the dorsal midfoot   Radiographs: Taken reviewed midfoot degenerative changes no acute fractures dislocations Assessment:   1. Capsulitis of left foot   2. Pain in joint of left foot   3. Osteoarthritis of right ankle or foot    Plan:  Patient was evaluated and treated and all questions answered.  Capsulitis -X-rays reviewed as above -Consider injection at next visit -Rx meloxicam  Return in about 6 weeks (around 01/04/2018).

## 2018-01-02 ENCOUNTER — Encounter: Payer: BC Managed Care – PPO | Admitting: Orthotics

## 2018-01-02 ENCOUNTER — Encounter: Payer: Self-pay | Admitting: Podiatry

## 2018-01-02 ENCOUNTER — Ambulatory Visit: Payer: BC Managed Care – PPO | Admitting: Podiatry

## 2018-01-02 DIAGNOSIS — M25572 Pain in left ankle and joints of left foot: Secondary | ICD-10-CM | POA: Diagnosis not present

## 2018-01-02 DIAGNOSIS — M19071 Primary osteoarthritis, right ankle and foot: Secondary | ICD-10-CM

## 2018-01-02 DIAGNOSIS — M778 Other enthesopathies, not elsewhere classified: Secondary | ICD-10-CM

## 2018-01-02 DIAGNOSIS — M779 Enthesopathy, unspecified: Secondary | ICD-10-CM | POA: Diagnosis not present

## 2018-01-03 ENCOUNTER — Encounter: Payer: Self-pay | Admitting: Obstetrics and Gynecology

## 2018-01-03 DIAGNOSIS — Z113 Encounter for screening for infections with a predominantly sexual mode of transmission: Secondary | ICD-10-CM

## 2018-01-05 NOTE — Progress Notes (Signed)
  Subjective:  Patient ID: Beth Sanders, female    DOB: 1980-03-01,  MRN: [id number]  Chief Complaint  Patient presents with  . Foot Pain    Follow up capsulitis left   "Its doing much better"    37 y.o. female presents with the above complaint.  States both feet are doing much better denies any pain very pleased with her orthotics.   Review of Systems: Negative except as noted in the HPI. Denies N/V/F/Ch.  Past Medical History:  Diagnosis Date  . BRCA negative 2017   MyRisk neg  . Family history of breast cancer   . Hypothyroid   . Increased risk of breast cancer 2017   IBIS=29.6%  . Type 2 diabetes mellitus ([place])   . Vitamin D deficiency     Current Outpatient Medications:  .  levothyroxine (SYNTHROID, LEVOTHROID) 100 MCG tablet, TAKE 1 TABLET BY MOUTH ONCE DAILY (EXCEPT  FOR  SUNDAYS  TAKE  2  TABLETS  BY  MOUTH), Disp: 90 tablet, Rfl: 1 .  lisinopril (PRINIVIL,ZESTRIL) 5 MG tablet, Take 1 tablet (5 mg total) by mouth daily., Disp: 90 tablet, Rfl: 1 .  meloxicam (MOBIC) 15 MG tablet, Take 1 tablet (15 mg total) by mouth daily., Disp: 30 tablet, Rfl: 0 .  metFORMIN (GLUCOPHAGE) 500 MG tablet, Take 1 tablet (500 mg total) by mouth 2 (two) times daily with a meal., Disp: 180 tablet, Rfl: 1  Social History   Tobacco Use  Smoking Status Never Smoker  Smokeless Tobacco Never Used    No Known Allergies Objective:   There were no vitals filed for this visit. There is no height or weight on file to calculate BMI. Constitutional Well developed. Well nourished.  Vascular Dorsalis pedis pulses palpable bilaterally. Posterior tibial pulses palpable bilaterally. Capillary refill normal to all digits.  No cyanosis or clubbing noted. Pedal hair growth normal.  Neurologic Normal speech. Oriented to person, place, and time. Epicritic sensation to light touch grossly present bilaterally.  Dermatologic Nails well groomed and normal in appearance. No open wounds. No skin  lesions.  Orthopedic: Normal joint ROM without pain or crepitus bilaterally. No visible deformities. No pain to palpation about the feet   Radiographs: None Assessment:   1. Capsulitis of left foot    Plan:  Patient was evaluated and treated and all questions answered.  Capsulitis -Doing very well no pain continue orthotics follow-up next year for additional pair advised to follow-up sooner should pain recur  No follow-ups on file.

## 2018-01-08 ENCOUNTER — Other Ambulatory Visit (INDEPENDENT_AMBULATORY_CARE_PROVIDER_SITE_OTHER): Payer: BC Managed Care – PPO

## 2018-01-08 ENCOUNTER — Other Ambulatory Visit: Payer: Self-pay | Admitting: Obstetrics and Gynecology

## 2018-01-08 DIAGNOSIS — E039 Hypothyroidism, unspecified: Secondary | ICD-10-CM

## 2018-01-08 DIAGNOSIS — Z0000 Encounter for general adult medical examination without abnormal findings: Secondary | ICD-10-CM

## 2018-01-08 DIAGNOSIS — E119 Type 2 diabetes mellitus without complications: Secondary | ICD-10-CM

## 2018-01-09 LAB — COMPREHENSIVE METABOLIC PANEL
A/G RATIO: 1.3 (ref 1.2–2.2)
ALT: 34 IU/L — AB (ref 0–32)
AST: 22 IU/L (ref 0–40)
Albumin: 4.3 g/dL (ref 3.5–5.5)
Alkaline Phosphatase: 63 IU/L (ref 39–117)
BUN/Creatinine Ratio: 12 (ref 9–23)
BUN: 11 mg/dL (ref 6–20)
CHLORIDE: 100 mmol/L (ref 96–106)
CO2: 23 mmol/L (ref 20–29)
Calcium: 8.9 mg/dL (ref 8.7–10.2)
Creatinine, Ser: 0.91 mg/dL (ref 0.57–1.00)
GFR calc Af Amer: 93 mL/min/{1.73_m2} (ref >59)
GFR calc non Af Amer: 80 mL/min/{1.73_m2} (ref >59)
GLUCOSE: 91 mg/dL (ref 65–99)
Globulin, Total: 3.4 g/dL (ref 1.5–4.5)
POTASSIUM: 4.3 mmol/L (ref 3.5–5.2)
Sodium: 140 mmol/L (ref 134–144)
Total Protein: 7.7 g/dL (ref 6.0–8.5)

## 2018-01-09 LAB — LIPID PANEL
Chol/HDL Ratio: 3.2 ratio (ref 0.0–4.4)
Cholesterol, Total: 170 mg/dL (ref 100–199)
HDL: 53 mg/dL (ref >39)
LDL Calculated: 99 mg/dL (ref 0–99)
TRIGLYCERIDES: 91 mg/dL (ref 0–149)
VLDL Cholesterol Cal: 18 mg/dL (ref 5–40)

## 2018-01-09 LAB — HEMOGLOBIN A1C
ESTIMATED AVERAGE GLUCOSE: 151 mg/dL
HEMOGLOBIN A1C: 6.9 % — AB (ref 4.8–5.6)

## 2018-01-09 LAB — TSH+FREE T4
Free T4: 1.69 ng/dL (ref 0.82–1.77)
TSH: 1.63 u[IU]/mL (ref 0.450–4.500)

## 2018-01-09 LAB — HEPATITIS C ANTIBODY: Hep C Virus Ab: <0.1 s/co ratio (ref 0.0–0.9)

## 2018-01-09 LAB — HSV 2 ANTIBODY, IGG: HSV 2 IgG, Type Spec: <0.91 index (ref 0.00–0.90)

## 2018-01-09 LAB — HIV ANTIBODY (ROUTINE TESTING W REFLEX): HIV Screen 4th Generation wRfx: NONREACTIVE

## 2018-01-09 LAB — RPR: RPR Ser Ql: NONREACTIVE

## 2018-01-13 ENCOUNTER — Telehealth: Payer: Self-pay | Admitting: Obstetrics and Gynecology

## 2018-01-13 DIAGNOSIS — E119 Type 2 diabetes mellitus without complications: Secondary | ICD-10-CM

## 2018-01-13 DIAGNOSIS — E039 Hypothyroidism, unspecified: Secondary | ICD-10-CM

## 2018-01-13 MED ORDER — LISINOPRIL 5 MG PO TABS: 5.0000 mg | ORAL_TABLET | Freq: Every day | ORAL | 1 refills | Status: DC

## 2018-01-13 MED ORDER — LEVOTHYROXINE SODIUM 100 MCG PO TABS: ORAL_TABLET | ORAL | 1 refills | Status: DC

## 2018-01-13 MED ORDER — METFORMIN HCL 500 MG PO TABS: 500.0000 mg | ORAL_TABLET | Freq: Two times a day (BID) | ORAL | 1 refills | Status: DC

## 2018-01-13 NOTE — Telephone Encounter (Signed)
Pt aware of recent thyroid, DM, and lipid labs. Takes metformin 500 mg BID but sometimes forgets to take 2nd dose. HgA1C increased to 6.9% from 6.4%. Also is on levo and lisinopril. Rx RF meds. Rechk labs in 6 months.   Meds ordered this encounter  Medications  . levothyroxine (SYNTHROID, LEVOTHROID) 100 MCG tablet    Sig: TAKE 1 TABLET BY MOUTH ONCE DAILY (EXCEPT  FOR  SUNDAYS  TAKE  2  TABLETS  BY  MOUTH)    Dispense:  90 tablet    Refill:  1    Please consider 90 day supplies to promote better adherence    Order Specific Question:   Supervising Provider    Answer:   Nadara MustardHARRIS, ROBERT P [[id number]]  . lisinopril (PRINIVIL,ZESTRIL) 5 MG tablet    Sig: Take 1 tablet (5 mg total) by mouth daily.    Dispense:  90 tablet    Refill:  1    Order Specific Question:   Supervising Provider    Answer:   Nadara MustardHARRIS, ROBERT P [id number]]  . metFORMIN (GLUCOPHAGE) 500 MG tablet    Sig: Take 1 tablet (500 mg total) by mouth 2 (two) times daily with a meal.    Dispense:  180 tablet    Refill:  1    Order Specific Question:   Supervising Provider    Answer:   Nadara MustardHARRIS, ROBERT P [[id number]]

## 2018-06-11 ENCOUNTER — Other Ambulatory Visit: Payer: Self-pay | Admitting: Obstetrics and Gynecology

## 2018-06-11 DIAGNOSIS — E039 Hypothyroidism, unspecified: Secondary | ICD-10-CM

## 2018-06-13 ENCOUNTER — Encounter: Payer: Self-pay | Admitting: Obstetrics and Gynecology

## 2018-06-14 ENCOUNTER — Other Ambulatory Visit: Payer: Self-pay | Admitting: Obstetrics and Gynecology

## 2018-06-14 ENCOUNTER — Other Ambulatory Visit: Payer: Self-pay | Admitting: Podiatry

## 2018-06-14 DIAGNOSIS — E039 Hypothyroidism, unspecified: Secondary | ICD-10-CM

## 2018-06-14 MED ORDER — LEVOTHYROXINE SODIUM 100 MCG PO TABS: ORAL_TABLET | ORAL | 0 refills | Status: DC

## 2018-06-16 ENCOUNTER — Encounter: Payer: Self-pay | Admitting: Podiatry

## 2018-06-16 MED ORDER — MELOXICAM 15 MG PO TABS: 15.0000 mg | ORAL_TABLET | Freq: Every day | ORAL | 0 refills | Status: DC

## 2018-06-18 ENCOUNTER — Other Ambulatory Visit: Payer: Self-pay

## 2018-06-18 ENCOUNTER — Other Ambulatory Visit: Payer: BC Managed Care – PPO

## 2018-06-18 DIAGNOSIS — E119 Type 2 diabetes mellitus without complications: Secondary | ICD-10-CM

## 2018-06-18 DIAGNOSIS — E039 Hypothyroidism, unspecified: Secondary | ICD-10-CM

## 2018-06-19 ENCOUNTER — Telehealth: Payer: Self-pay | Admitting: Obstetrics and Gynecology

## 2018-06-19 DIAGNOSIS — E119 Type 2 diabetes mellitus without complications: Secondary | ICD-10-CM

## 2018-06-19 DIAGNOSIS — E039 Hypothyroidism, unspecified: Secondary | ICD-10-CM

## 2018-06-19 LAB — MICROALBUMIN / CREATININE URINE RATIO
Creatinine, Urine: 121.5 mg/dL
Microalb/Creat Ratio: 20 mg/g creat (ref 0–29)
Microalbumin, Urine: 24.7 ug/mL

## 2018-06-19 LAB — COMPREHENSIVE METABOLIC PANEL
ALT: 22 IU/L (ref 0–32)
AST: 18 IU/L (ref 0–40)
Albumin/Globulin Ratio: 1.3 (ref 1.2–2.2)
Albumin: 4.2 g/dL (ref 3.8–4.8)
Alkaline Phosphatase: 65 IU/L (ref 39–117)
BUN/Creatinine Ratio: 10 (ref 9–23)
BUN: 9 mg/dL (ref 6–20)
Bilirubin Total: <0.2 mg/dL (ref 0.0–1.2)
CO2: 21 mmol/L (ref 20–29)
Calcium: 9.1 mg/dL (ref 8.7–10.2)
Chloride: 104 mmol/L (ref 96–106)
Creatinine, Ser: 0.92 mg/dL (ref 0.57–1.00)
GFR calc Af Amer: 91 mL/min/{1.73_m2} (ref >59)
GFR calc non Af Amer: 79 mL/min/{1.73_m2} (ref >59)
Globulin, Total: 3.3 g/dL (ref 1.5–4.5)
Glucose: 98 mg/dL (ref 65–99)
Potassium: 5 mmol/L (ref 3.5–5.2)
Sodium: 142 mmol/L (ref 134–144)
Total Protein: 7.5 g/dL (ref 6.0–8.5)

## 2018-06-19 LAB — TSH+FREE T4
Free T4: 1.61 ng/dL (ref 0.82–1.77)
TSH: 1.46 u[IU]/mL (ref 0.450–4.500)

## 2018-06-19 LAB — HEMOGLOBIN A1C
Est. average glucose Bld gHb Est-mCnc: 143 mg/dL
Hgb A1c MFr Bld: 6.6 % — ABNORMAL HIGH (ref 4.8–5.6)

## 2018-06-19 LAB — LIPID PANEL
Chol/HDL Ratio: 3.5 ratio (ref 0.0–4.4)
Cholesterol, Total: 170 mg/dL (ref 100–199)
HDL: 48 mg/dL (ref >39)
LDL Calculated: 103 mg/dL — ABNORMAL HIGH (ref 0–99)
Triglycerides: 95 mg/dL (ref 0–149)
VLDL Cholesterol Cal: 19 mg/dL (ref 5–40)

## 2018-06-19 MED ORDER — LISINOPRIL 5 MG PO TABS: 5.0000 mg | ORAL_TABLET | Freq: Every day | ORAL | 1 refills | Status: DC

## 2018-06-19 MED ORDER — LEVOTHYROXINE SODIUM 100 MCG PO TABS: ORAL_TABLET | ORAL | 1 refills | Status: DC

## 2018-06-19 MED ORDER — METFORMIN HCL 500 MG PO TABS: 500.0000 mg | ORAL_TABLET | Freq: Two times a day (BID) | ORAL | 1 refills | Status: DC

## 2018-06-19 NOTE — Telephone Encounter (Signed)
Pt aware of labs. Improvd HgA1C with proper dosing of metformin BID. Doing well with meds. Rx RF. Rechk at 10/20 annual.   Recent Results (from the past 2160 hour(s))  Comprehensive metabolic panel     Status: None   Collection Time: 06/18/18  9:54 AM  Result Value Ref Range   Glucose 98 65 - 99 mg/dL   BUN 9 6 - 20 mg/dL   Creatinine, Ser 1.610.92 0.57 - 1.00 mg/dL   GFR calc non Af Amer 79 >59 mL/min/1.73   GFR calc Af Amer 91 >59 mL/min/1.73   BUN/Creatinine Ratio 10 9 - 23   Sodium 142 134 - 144 mmol/L   Potassium 5.0 3.5 - 5.2 mmol/L   Chloride 104 96 - 106 mmol/L   CO2 21 20 - 29 mmol/L   Calcium 9.1 8.7 - 10.2 mg/dL   Total Protein 7.5 6.0 - 8.5 g/dL   Albumin 4.2 3.8 - 4.8 g/dL   Globulin, Total 3.3 1.5 - 4.5 g/dL   Albumin/Globulin Ratio 1.3 1.2 - 2.2   Bilirubin Total <0.2 0.0 - 1.2 mg/dL   Alkaline Phosphatase 65 39 - 117 IU/L   AST 18 0 - 40 IU/L   ALT 22 0 - 32 IU/L  Lipid panel     Status: Abnormal   Collection Time: 06/18/18  9:54 AM  Result Value Ref Range   Cholesterol, Total 170 100 - 199 mg/dL   Triglycerides 95 0 - 149 mg/dL   HDL 48 >09>39 mg/dL   VLDL Cholesterol Cal 19 5 - 40 mg/dL   LDL Calculated 604103 (H) 0 - 99 mg/dL   Chol/HDL Ratio 3.5 0.0 - 4.4 ratio    Comment:                                   T. Chol/HDL Ratio                                             Men  Women                               1/2 Avg.Risk  3.4    3.3                                   Avg.Risk  5.0    4.4                                2X Avg.Risk  9.6    7.1                                3X Avg.Risk 23.4   11.0   Hemoglobin A1c     Status: Abnormal   Collection Time: 06/18/18  9:54 AM  Result Value Ref Range   Hgb A1c MFr Bld 6.6 (H) 4.8 - 5.6 %    Comment:          Prediabetes: 5.7 - 6.4          Diabetes: >6.4  Glycemic control for adults with diabetes: <7.0    Est. average glucose Bld gHb Est-mCnc 143 mg/dL  TSH + free T4     Status: None   Collection Time:  06/18/18  9:54 AM  Result Value Ref Range   TSH 1.460 0.450 - 4.500 uIU/mL   Free T4 1.61 0.82 - 1.77 ng/dL   Meds ordered this encounter  Medications  . levothyroxine (SYNTHROID) 100 MCG tablet    Sig: TAKE 1 TABLET BY MOUTH ONCE DAILY (EXCEPT  FOR  SUNDAYS  TAKE  2  TABLETS  BY  MOUTH)    Dispense:  105 tablet    Refill:  1    Please consider 90 day supplies to promote better adherence  . lisinopril (ZESTRIL) 5 MG tablet    Sig: Take 1 tablet (5 mg total) by mouth daily.    Dispense:  90 tablet    Refill:  1  . metFORMIN (GLUCOPHAGE) 500 MG tablet    Sig: Take 1 tablet (500 mg total) by mouth 2 (two) times daily with a meal.    Dispense:  180 tablet    Refill:  1

## 2018-07-30 ENCOUNTER — Other Ambulatory Visit: Payer: Self-pay | Admitting: Obstetrics and Gynecology

## 2018-07-30 DIAGNOSIS — E039 Hypothyroidism, unspecified: Secondary | ICD-10-CM

## 2018-07-30 NOTE — Telephone Encounter (Signed)
Please advise 

## 2018-09-01 ENCOUNTER — Encounter: Payer: Self-pay | Admitting: Obstetrics and Gynecology

## 2019-01-17 ENCOUNTER — Other Ambulatory Visit: Payer: Self-pay | Admitting: Obstetrics and Gynecology

## 2019-01-17 DIAGNOSIS — E119 Type 2 diabetes mellitus without complications: Secondary | ICD-10-CM

## 2019-01-20 ENCOUNTER — Encounter: Payer: Self-pay | Admitting: Obstetrics and Gynecology

## 2019-01-20 ENCOUNTER — Other Ambulatory Visit: Payer: Self-pay | Admitting: Obstetrics and Gynecology

## 2019-01-20 ENCOUNTER — Telehealth: Payer: Self-pay | Admitting: Obstetrics and Gynecology

## 2019-01-20 DIAGNOSIS — E039 Hypothyroidism, unspecified: Secondary | ICD-10-CM

## 2019-01-20 DIAGNOSIS — E119 Type 2 diabetes mellitus without complications: Secondary | ICD-10-CM

## 2019-01-20 MED ORDER — LISINOPRIL 5 MG PO TABS: 5.0000 mg | ORAL_TABLET | Freq: Every day | ORAL | 0 refills | Status: DC

## 2019-01-20 NOTE — Telephone Encounter (Signed)
Rx eRxd.  

## 2019-01-20 NOTE — Telephone Encounter (Signed)
Patient is schedule for 02/25/19 with ABC. Patient is requesting refill on her medication. Please advise

## 2019-01-20 NOTE — Telephone Encounter (Signed)
Lisinopril 

## 2019-01-20 NOTE — Progress Notes (Signed)
Rx RF lisinopril until 1/21 appt

## 2019-01-21 ENCOUNTER — Telehealth: Payer: Self-pay | Admitting: Obstetrics and Gynecology

## 2019-01-21 NOTE — Telephone Encounter (Signed)
Lab order placed.

## 2019-01-21 NOTE — Telephone Encounter (Signed)
Patient is schedule for Monday,01/26/19 for labs. Please place order thank you!

## 2019-01-26 ENCOUNTER — Other Ambulatory Visit: Payer: BC Managed Care – PPO

## 2019-01-27 ENCOUNTER — Other Ambulatory Visit: Payer: Self-pay

## 2019-01-27 ENCOUNTER — Other Ambulatory Visit: Payer: BC Managed Care – PPO

## 2019-01-27 DIAGNOSIS — E039 Hypothyroidism, unspecified: Secondary | ICD-10-CM

## 2019-01-28 ENCOUNTER — Other Ambulatory Visit: Payer: Self-pay | Admitting: Obstetrics and Gynecology

## 2019-01-28 DIAGNOSIS — E039 Hypothyroidism, unspecified: Secondary | ICD-10-CM

## 2019-01-28 LAB — TSH+FREE T4
Free T4: 1.65 ng/dL (ref 0.82–1.77)
TSH: 1.19 u[IU]/mL (ref 0.450–4.500)

## 2019-01-28 MED ORDER — LEVOTHYROXINE SODIUM 100 MCG PO TABS: ORAL_TABLET | ORAL | 1 refills | Status: DC

## 2019-01-28 NOTE — Progress Notes (Signed)
Rx RF levo for hypothyroidism.

## 2019-02-25 ENCOUNTER — Ambulatory Visit (INDEPENDENT_AMBULATORY_CARE_PROVIDER_SITE_OTHER): Payer: BC Managed Care – PPO | Admitting: Obstetrics and Gynecology

## 2019-02-25 ENCOUNTER — Other Ambulatory Visit: Payer: Self-pay

## 2019-02-25 ENCOUNTER — Other Ambulatory Visit (HOSPITAL_COMMUNITY)
Admission: RE | Admit: 2019-02-25 | Discharge: 2019-02-25 | Disposition: A | Discharge status: Home / Self Care | Payer: BC Managed Care – PPO | Source: Ambulatory Visit | Attending: Obstetrics and Gynecology | Admitting: Obstetrics and Gynecology

## 2019-02-25 ENCOUNTER — Encounter: Payer: Self-pay | Admitting: Obstetrics and Gynecology

## 2019-02-25 VITALS — BP 108/70 | Ht 66.0 in | Wt 187.0 lb

## 2019-02-25 DIAGNOSIS — Z01419 Encounter for gynecological examination (general) (routine) without abnormal findings: Secondary | ICD-10-CM

## 2019-02-25 DIAGNOSIS — Z1231 Encounter for screening mammogram for malignant neoplasm of breast: Secondary | ICD-10-CM

## 2019-02-25 DIAGNOSIS — Z803 Family history of malignant neoplasm of breast: Secondary | ICD-10-CM

## 2019-02-25 DIAGNOSIS — Z113 Encounter for screening for infections with a predominantly sexual mode of transmission: Secondary | ICD-10-CM | POA: Insufficient documentation

## 2019-02-25 DIAGNOSIS — E039 Hypothyroidism, unspecified: Secondary | ICD-10-CM

## 2019-02-25 DIAGNOSIS — Z9189 Other specified personal risk factors, not elsewhere classified: Secondary | ICD-10-CM

## 2019-02-25 DIAGNOSIS — E119 Type 2 diabetes mellitus without complications: Secondary | ICD-10-CM

## 2019-02-25 DIAGNOSIS — Z0000 Encounter for general adult medical examination without abnormal findings: Secondary | ICD-10-CM

## 2019-02-25 NOTE — Progress Notes (Signed)
PCP:  Chad Cordial, PA-C   Chief Complaint  Patient presents with  . Gynecologic Exam  . STD testing     HPI:      [name] is a 39 y.o. No obstetric history on file. who LMP was Patient's last menstrual period was 02/05/2019 (exact date)., presents today for her annual examination.  Her menses are regular every 28-30 days, lasting 4 days.  Dysmenorrhea none. She does not have intermenstrual bleeding.  Sex activity: single partner, contraception - condoms. Declines any other BC.  Last Pap: 09/08/15  Results were: no abnormalities /neg HPV DNA  Hx of STDs: HPV  Last mammogram: 02/26/17 Results were: normal--routine follow-up in 12 months There is a FH of breast cancer in her mom. Pt is MyRisk neg 2017. IBIS=29.6% . There is no FH of ovarian cancer. The patient does do self-breast exams. She stopped taking Vit D supp. She has not had a screening breast MRI.  Tobacco use: The patient denies current or previous tobacco use. Alcohol use: none No drug use.  Exercise: not active  She does get adequate calcium but not Vitamin D in her diet.  She has a hx of type 2 DM and is on metformin 500 mg BID. HgA1C was 6.4 6/19. She is on lisinopril for kidney protection. Urine creatinine and albumin WNL 10/25/16. Lipids WNL 12/18. She sees opthalmology yearly. No side effects from meds.   She has hypothyroidism and is taking levo 100 mcg daily with 2 tabs Sun. TSH WNL 12/20.  Due for all labs to be repeated 12/19. Has Rx RF till then.  Pt notes joint pain in feet, ankles and knees intermittently. Sx come and go. Usually LT side more than RT. Is not exercising. Sx improve if she wears orthotics.    Past Medical History:  Diagnosis Date  . BRCA negative 2017   MyRisk neg  . Family history of breast cancer   . Hypothyroid   . Increased risk of breast cancer 2017   IBIS=29.6%  . Type 2 diabetes mellitus ([place])   . Vitamin D deficiency     Past Surgical History:  Procedure  Laterality Date  . WISDOM TOOTH EXTRACTION      Family History  Problem Relation Age of Onset  . Breast cancer Mother 49  . Kidney cancer Mother 50  . Diabetes type II Father   . Ovarian cancer Maternal Aunt 59       has contact  . Colon cancer Neg Hx     Social History   Socioeconomic History  . Marital status: Single    Spouse name: Not on file  . Number of children: Not on file  . Years of education: Not on file  . Highest education level: Not on file  Occupational History  . Not on file  Tobacco Use  . Smoking status: Never Smoker  . Smokeless tobacco: Never Used  Substance and Sexual Activity  . Alcohol use: Yes    Comment: socially  . Drug use: No  . Sexual activity: Yes    Partners: Male    Birth control/protection: Condom  Other Topics Concern  . Not on file  Social History Narrative  . Not on file   Social Determinants of Health   Financial Resource Strain:   . Difficulty of Paying Living Expenses: Not on file  Food Insecurity:   . Worried About Charity fundraiser in the Last Year: Not on file  .  Ran Out of Food in the Last Year: Not on file  Transportation Needs:   . Lack of Transportation (Medical): Not on file  . Lack of Transportation (Non-Medical): Not on file  Physical Activity:   . Days of Exercise per Week: Not on file  . Minutes of Exercise per Session: Not on file  Stress:   . Feeling of Stress : Not on file  Social Connections:   . Frequency of Communication with Friends and Family: Not on file  . Frequency of Social Gatherings with Friends and Family: Not on file  . Attends Religious Services: Not on file  . Active Member of Clubs or Organizations: Not on file  . Attends Archivist Meetings: Not on file  . Marital Status: Not on file  Intimate Partner Violence:   . Fear of Current or Ex-Partner: Not on file  . Emotionally Abused: Not on file  . Physically Abused: Not on file  . Sexually Abused: Not on file    Current  Meds  Medication Sig  . levothyroxine (SYNTHROID) 100 MCG tablet TAKE 1 TABLET BY MOUTH ONCE DAILY (EXCEPT  FOR  SUNDAYS  TAKE  2  TABLETS  BY  MOUTH)  . lisinopril (ZESTRIL) 5 MG tablet Take 1 tablet (5 mg total) by mouth daily.  . meloxicam (MOBIC) 15 MG tablet Take 1 tablet (15 mg total) by mouth daily.  . metFORMIN (GLUCOPHAGE) 500 MG tablet Take 1 tablet (500 mg total) by mouth 2 (two) times daily with a meal.     ROS:  Review of Systems  Constitutional: Negative for fatigue, fever and unexpected weight change.  Respiratory: Negative for cough, shortness of breath and wheezing.   Cardiovascular: Negative for chest pain, palpitations and leg swelling.  Gastrointestinal: Negative for blood in stool, constipation, diarrhea, nausea and vomiting.  Endocrine: Negative for cold intolerance, heat intolerance and polyuria.  Genitourinary: Negative for dyspareunia, dysuria, flank pain, frequency, genital sores, hematuria, menstrual problem, pelvic pain, urgency, vaginal bleeding, vaginal discharge and vaginal pain.  Musculoskeletal: Positive for arthralgias. Negative for back pain, joint swelling and myalgias.  Skin: Negative for rash.  Neurological: Negative for dizziness, syncope, light-headedness, numbness and headaches.  Hematological: Negative for adenopathy.  Psychiatric/Behavioral: Negative for agitation, confusion, sleep disturbance and suicidal ideas. The patient is not nervous/anxious.      Objective: BP 108/70   Ht _0  (1.676 m)   Wt 187 lb (84.8 kg)   LMP 02/05/2019 (Exact Date)   BMI 30.18 kg/m    Physical Exam Constitutional:      Appearance: She is well-developed.  Genitourinary:     Vagina and uterus normal.     No vaginal discharge, erythema or tenderness.     No cervical motion tenderness or polyp.     Uterus is not enlarged or tender.     No right or left adnexal mass present.     Right adnexa not tender.     Left adnexa not tender.  Neck:     Thyroid:  No thyromegaly.  Cardiovascular:     Rate and Rhythm: Normal rate and regular rhythm.     Heart sounds: Normal heart sounds. No murmur.  Pulmonary:     Effort: Pulmonary effort is normal.     Breath sounds: Normal breath sounds.  Chest:     Breasts:        Right: No mass, nipple discharge, skin change or tenderness.        Left: No mass,  nipple discharge, skin change or tenderness.  Abdominal:     Palpations: Abdomen is soft.     Tenderness: There is no abdominal tenderness. There is no guarding.  Musculoskeletal:        General: Normal range of motion.     Cervical back: Normal range of motion.  Neurological:     Mental Status: She is alert and oriented to person, place, and time.     Cranial Nerves: No cranial nerve deficit.  Psychiatric:        Behavior: Behavior normal.  Vitals reviewed.     Assessment/Plan: Encounter for annual routine gynecological examination  Screening for STD (sexually transmitted disease) - Plan: [facility] STD, HIV, RPR, HSV 2 antibody, IgG, Hepatitis C Ab  Encounter for screening mammogram for malignant neoplasm of breast - Plan: 3D MAMMOGRAM SCREENING BILATERAL; pt to sched mammo  Family history of breast cancer--Pt is MyRisk neg.   Increased risk of breast cancer--cont SBE, yearly CBE and mammos. Discussed scr breast MRI. Pt to f/u within 6 months of mammo for MRI. Cont ViT D supp  Blood tests for routine general physical examination - Plan: Comprehensive metabolic panel, Lipid panel, Hemoglobin A1c, Microalbumin / creatinine urine ratio  Acquired hypothyroidism - Labs WNL 12/20. Repeat due 6/21.  Type 2 diabetes mellitus without complication, without long-term current use of insulin ([facility]) - Plan: Comprehensive metabolic panel, Lipid panel, Hemoglobin A1c, Microalbumin / creatinine urine ratio; Labs due. Will f/u with results and Rx RF based on labs.     GYN counsel breast self exam, mammography screening, adequate intake of calcium and vitamin D,  diet and exercise     F/U  Return in about 1 year (around 07/16/7996) for annual.  Tangela Dolliver B. Faythe Heitzenrater, PA-C 02/25/2019 4:46 PM

## 2019-02-25 NOTE — Patient Instructions (Addendum)
I value your feedback and entrusting [place] with your care. If you get a [facility] patient survey, I would appreciate you taking the time to let us know about your experience today. Thank you!  As of January 22, 2019, your lab results will be released to your MyChart immediately, before I even have a chance to see them. Please give me time to review them and contact you if there are any abnormalities. Thank you for your patience.   [facility]: [phone]  Fasting labs due

## 2019-02-27 LAB — CERVICOVAGINAL ANCILLARY ONLY
Chlamydia: NEGATIVE
Comment: NEGATIVE
Comment: NEGATIVE
Comment: NORMAL
Neisseria Gonorrhea: NEGATIVE
Trichomonas: NEGATIVE

## 2019-03-04 ENCOUNTER — Other Ambulatory Visit: Payer: Self-pay

## 2019-03-04 ENCOUNTER — Other Ambulatory Visit: Payer: BC Managed Care – PPO

## 2019-03-04 DIAGNOSIS — Z113 Encounter for screening for infections with a predominantly sexual mode of transmission: Secondary | ICD-10-CM

## 2019-03-04 DIAGNOSIS — E119 Type 2 diabetes mellitus without complications: Secondary | ICD-10-CM

## 2019-03-04 DIAGNOSIS — Z0000 Encounter for general adult medical examination without abnormal findings: Secondary | ICD-10-CM

## 2019-03-05 ENCOUNTER — Telehealth: Payer: Self-pay | Admitting: Obstetrics and Gynecology

## 2019-03-05 DIAGNOSIS — E119 Type 2 diabetes mellitus without complications: Secondary | ICD-10-CM

## 2019-03-05 LAB — LIPID PANEL
Chol/HDL Ratio: 3.5 ratio (ref 0.0–4.4)
Cholesterol, Total: 212 mg/dL — ABNORMAL HIGH (ref 100–199)
HDL: 61 mg/dL (ref >39)
LDL Chol Calc (NIH): 138 mg/dL — ABNORMAL HIGH (ref 0–99)
Triglycerides: 74 mg/dL (ref 0–149)
VLDL Cholesterol Cal: 13 mg/dL (ref 5–40)

## 2019-03-05 LAB — COMPREHENSIVE METABOLIC PANEL
ALT: 21 IU/L (ref 0–32)
AST: 15 IU/L (ref 0–40)
Albumin/Globulin Ratio: 1.3 (ref 1.2–2.2)
Albumin: 4.8 g/dL (ref 3.8–4.8)
Alkaline Phosphatase: 68 IU/L (ref 39–117)
BUN/Creatinine Ratio: 16 (ref 9–23)
BUN: 14 mg/dL (ref 6–20)
Bilirubin Total: <0.2 mg/dL (ref 0.0–1.2)
CO2: 22 mmol/L (ref 20–29)
Calcium: 9.8 mg/dL (ref 8.7–10.2)
Chloride: 102 mmol/L (ref 96–106)
Creatinine, Ser: 0.87 mg/dL (ref 0.57–1.00)
GFR calc Af Amer: 97 mL/min/{1.73_m2} (ref >59)
GFR calc non Af Amer: 84 mL/min/{1.73_m2} (ref >59)
Globulin, Total: 3.7 g/dL (ref 1.5–4.5)
Glucose: 95 mg/dL (ref 65–99)
Potassium: 4.7 mmol/L (ref 3.5–5.2)
Sodium: 138 mmol/L (ref 134–144)
Total Protein: 8.5 g/dL (ref 6.0–8.5)

## 2019-03-05 LAB — HEMOGLOBIN A1C
Est. average glucose Bld gHb Est-mCnc: 128 mg/dL
Hgb A1c MFr Bld: 6.1 % — ABNORMAL HIGH (ref 4.8–5.6)

## 2019-03-05 LAB — HSV 2 ANTIBODY, IGG: HSV 2 IgG, Type Spec: <0.91 index (ref 0.00–0.90)

## 2019-03-05 LAB — RPR: RPR Ser Ql: NONREACTIVE

## 2019-03-05 LAB — HEPATITIS C ANTIBODY: Hep C Virus Ab: <0.1 s/co ratio (ref 0.0–0.9)

## 2019-03-05 LAB — HIV ANTIBODY (ROUTINE TESTING W REFLEX): HIV Screen 4th Generation wRfx: NONREACTIVE

## 2019-03-05 MED ORDER — LISINOPRIL 5 MG PO TABS: 5.0000 mg | ORAL_TABLET | Freq: Every day | ORAL | 1 refills | Status: DC

## 2019-03-05 MED ORDER — METFORMIN HCL 500 MG PO TABS: 500.0000 mg | ORAL_TABLET | Freq: Two times a day (BID) | ORAL | 1 refills | Status: DC

## 2019-03-05 NOTE — Telephone Encounter (Signed)
Pt aware of labs. HgA1C improved to 6.1%. Cont metformin 500 mg BID. Rx eRxd, cont lisinopril for kidney protection. Lipids borderline now. Rechk in 6 months. Due for thyroid rechk in June, too (has RF). Pt also due for microalbumin/creatinine urine. Not done yesterday. Will do in June.   Meds ordered this encounter  Medications  . lisinopril (ZESTRIL) 5 MG tablet    Sig: Take 1 tablet (5 mg total) by mouth daily.    Dispense:  90 tablet    Refill:  1    Order Specific Question:   Supervising Provider    Answer:   Nadara Mustard [id number]  . metFORMIN (GLUCOPHAGE) 500 MG tablet    Sig: Take 1 tablet (500 mg total) by mouth 2 (two) times daily with a meal.    Dispense:  180 tablet    Refill:  1    Order Specific Question:   Supervising Provider    Answer:   Nadara Mustard [[id number]]

## 2019-04-06 ENCOUNTER — Encounter: Payer: Self-pay | Admitting: Obstetrics and Gynecology

## 2019-04-11 ENCOUNTER — Ambulatory Visit: Payer: BC Managed Care – PPO | Attending: Internal Medicine

## 2019-04-11 ENCOUNTER — Other Ambulatory Visit: Payer: Self-pay

## 2019-04-11 DIAGNOSIS — Z23 Encounter for immunization: Secondary | ICD-10-CM

## 2019-04-11 NOTE — Progress Notes (Signed)
   Covid-19 Vaccination Clinic  Name:  Beth Sanders    MRN: [id number] DOB: Sep 24, 1980  04/11/2019  Beth Sanders was observed post Covid-19 immunization for 15 minutes without incidence. She was provided with Vaccine Information Sheet and instruction to access the V-Safe system.   Beth Sanders was instructed to call 911 with any severe reactions post vaccine: Marland Kitchen Difficulty breathing  . Swelling of your face and throat  . A fast heartbeat  . A bad rash all over your body  . Dizziness and weakness    Immunizations Administered    Name Date Dose VIS Date Route   Moderna COVID-19 Vaccine 04/11/2019 10:59 AM 0.5 mL 01/13/2019 Intramuscular   Manufacturer: Moderna   Lot: [id number]   NDC: 07354-301-48

## 2019-04-17 ENCOUNTER — Other Ambulatory Visit: Payer: Self-pay | Admitting: Obstetrics and Gynecology

## 2019-04-17 DIAGNOSIS — E119 Type 2 diabetes mellitus without complications: Secondary | ICD-10-CM

## 2019-05-09 ENCOUNTER — Ambulatory Visit: Payer: BC Managed Care – PPO | Attending: Internal Medicine

## 2019-05-09 ENCOUNTER — Ambulatory Visit: Payer: BC Managed Care – PPO

## 2019-05-09 DIAGNOSIS — Z23 Encounter for immunization: Secondary | ICD-10-CM

## 2019-05-09 NOTE — Progress Notes (Signed)
   Covid-19 Vaccination Clinic  Name:  KEYARA ENT    MRN: [id number] DOB: 1980-07-10  05/09/2019  Ms. Tseng was observed post Covid-19 immunization for 15 minutes without incident. She was provided with Vaccine Information Sheet and instruction to access the V-Safe system.   Ms. Matto was instructed to call 911 with any severe reactions post vaccine: Marland Kitchen Difficulty breathing  . Swelling of face and throat  . A fast heartbeat  . A bad rash all over body  . Dizziness and weakness   Immunizations Administered    Name Date Dose VIS Date Route   Moderna COVID-19 Vaccine 05/09/2019 12:34 PM 0.5 mL 01/13/2019 Intramuscular   Manufacturer: Gala Murdoch   Lot: [id number]   NDC: 98338-250-53

## 2019-05-28 ENCOUNTER — Ambulatory Visit
Admission: EM | Admit: 2019-05-28 | Discharge: 2019-05-28 | Disposition: A | Discharge status: Home / Self Care | Payer: BC Managed Care – PPO | Attending: Emergency Medicine | Admitting: Emergency Medicine

## 2019-05-28 ENCOUNTER — Other Ambulatory Visit: Payer: Self-pay

## 2019-05-28 DIAGNOSIS — R110 Nausea: Secondary | ICD-10-CM | POA: Diagnosis not present

## 2019-05-28 DIAGNOSIS — R42 Dizziness and giddiness: Secondary | ICD-10-CM

## 2019-05-28 MED ORDER — ONDANSETRON HCL 4 MG PO TABS: 4.0000 mg | ORAL_TABLET | Freq: Four times a day (QID) | ORAL | 0 refills | Status: DC | PRN

## 2019-05-28 NOTE — Discharge Instructions (Signed)
Change positions slowly.  Increase your water intake to 8-10 eight ounce glasses each day.    Your blood work results will be back tomorrow.    Follow-up with your primary care provider if your symptoms are not improving.

## 2019-05-28 NOTE — ED Provider Notes (Signed)
Roderic Palau    CSN: [id number] Arrival date & time: 05/28/19  1502      History   Chief Complaint Chief Complaint  Patient presents with  . Nausea    HPI Beth Sanders is a 39 y.o. female.   Patient presents with onset of dizziness and nausea on 05/24/2019 after taking meloxicam on an empty stomach.  She describes the dizziness as brief intermittent room spinning.  She also ate fast food that day prior to her symptoms.  She states she vomited once on 05/25/2019 but none since.  She denies focal weakness, chest pain, SOB, fever, chills, abdominal pain, diarrhea, rash, or other symptoms.  Patient states she her symptoms have improved each day.  No treatments attempted at home.  She drinks only 1-2 glasses of water daily.  She declines COVID test.    The history is provided by the patient.    Past Medical History:  Diagnosis Date  . BRCA negative 2017   MyRisk neg  . Elevated lipids   . Family history of breast cancer   . Hypothyroid   . Increased risk of breast cancer 2017   IBIS=29.6%  . Type 2 diabetes mellitus ([place])   . Vitamin D deficiency     Patient Active Problem List   Diagnosis Date Noted  . Increased risk of breast cancer 10/30/2016  . Hypothyroidism (acquired) 08/20/2016  . Type 2 diabetes mellitus ([place]) 08/20/2016    Past Surgical History:  Procedure Laterality Date  . WISDOM TOOTH EXTRACTION      OB History    Gravida  0   Para  0   Term  0   Preterm  0   AB  0   Living  0     SAB  0   TAB  0   Ectopic  0   Multiple  0   Live Births  0            Home Medications    Prior to Admission medications   Medication Sig Start Date End Date Taking? Authorizing Provider  levothyroxine (SYNTHROID) 100 MCG tablet TAKE 1 TABLET BY MOUTH ONCE DAILY (EXCEPT  FOR  SUNDAYS  TAKE  2  TABLETS  BY  MOUTH) [birth year]  Yes Copland, Alicia B, PA-C  lisinopril (ZESTRIL) 5 MG tablet Take 1 tablet (5 mg total) by mouth daily. 03/05/19  Yes  Copland, Deirdre Evener, PA-C  meloxicam (MOBIC) 15 MG tablet Take 1 tablet (15 mg total) by mouth daily. 06/16/18  Yes Evelina Bucy, DPM  metFORMIN (GLUCOPHAGE) 500 MG tablet Take 1 tablet (500 mg total) by mouth 2 (two) times daily with a meal. 02/18/24  Yes Copland, Alicia B, PA-C  ondansetron (ZOFRAN) 4 MG tablet Take 1 tablet (4 mg total) by mouth every 6 (six) hours as needed for nausea or vomiting. 05/28/19   Sharion Balloon, NP    Family History Family History  Problem Relation Age of Onset  . Breast cancer Mother 70  . Kidney cancer Mother 34  . Diabetes type II Father   . Ovarian cancer Maternal Aunt 59       has contact  . Colon cancer Neg Hx     Social History Social History   Tobacco Use  . Smoking status: Never Smoker  . Smokeless tobacco: Never Used  Substance Use Topics  . Alcohol use: Yes    Comment: socially  . Drug use: No     Allergies  Patient has no known allergies.   Review of Systems Review of Systems  Constitutional: Negative for chills and fever.  HENT: Negative for ear pain and sore throat.   Eyes: Negative for pain and visual disturbance.  Respiratory: Negative for cough and shortness of breath.   Cardiovascular: Negative for chest pain and palpitations.  Gastrointestinal: Positive for nausea and vomiting. Negative for abdominal pain and diarrhea.  Genitourinary: Negative for dysuria and hematuria.  Musculoskeletal: Negative for arthralgias and back pain.  Skin: Negative for color change and rash.  Neurological: Positive for dizziness. Negative for seizures, syncope, facial asymmetry, speech difficulty, weakness, numbness and headaches.  All other systems reviewed and are negative.    Physical Exam Triage Vital Signs ED Triage Vitals  Enc Vitals Group     BP      Pulse      Resp      Temp      Temp src      SpO2      Weight      Height      Head Circumference      Peak Flow      Pain Score      Pain Loc      Pain Edu?      Excl.  in [place]?    Orthostatic VS for the past 24 hrs:  BP- Lying Pulse- Lying BP- Sitting Pulse- Sitting BP- Standing at 0 minutes Pulse- Standing at 0 minutes  05/28/19 1544 106/70 73 107/71 78 104/73 87    Updated Vital Signs BP 122/84 (BP Location: Left Arm)   Pulse 81   Temp 98.2 F (36.8 C) (Oral)   Resp 15   Ht 5' 6" (1.676 m)   Wt 170 lb (77.1 kg)   LMP 05/22/2019   SpO2 99%   BMI 27.44 kg/m   Visual Acuity Right Eye Distance:   Left Eye Distance:   Bilateral Distance:    Right Eye Near:   Left Eye Near:    Bilateral Near:     Physical Exam Vitals and nursing note reviewed.  Constitutional:      General: She is not in acute distress.    Appearance: She is well-developed. She is not ill-appearing.  HENT:     Head: Normocephalic and atraumatic.     Right Ear: Tympanic membrane normal.     Left Ear: Tympanic membrane normal.     Nose: Nose normal.     Mouth/Throat:     Mouth: Mucous membranes are moist.     Pharynx: Oropharynx is clear.  Eyes:     Conjunctiva/sclera: Conjunctivae normal.  Cardiovascular:     Rate and Rhythm: Normal rate and regular rhythm.     Heart sounds: Normal heart sounds. No murmur.  Pulmonary:     Effort: Pulmonary effort is normal. No respiratory distress.     Breath sounds: Normal breath sounds. No wheezing or rhonchi.  Abdominal:     General: Bowel sounds are normal.     Palpations: Abdomen is soft.     Tenderness: There is no abdominal tenderness. There is no guarding or rebound.  Musculoskeletal:     Cervical back: Neck supple.  Skin:    General: Skin is warm and dry.     Findings: No rash.  Neurological:     General: No focal deficit present.     Mental Status: She is alert and oriented to person, place, and time.     Cranial Nerves: No  cranial nerve deficit.     Sensory: No sensory deficit.     Motor: No weakness.     Coordination: Romberg sign negative. Coordination normal.     Gait: Gait normal.  Psychiatric:        Mood  and Affect: Mood normal.        Behavior: Behavior normal.      UC Treatments / Results  Labs (all labs ordered are listed, but only abnormal results are displayed) Labs Reviewed  CBC  BASIC METABOLIC PANEL    EKG   Radiology No results found.  Procedures Procedures (including critical care time)  Medications Ordered in UC Medications - No data to display  Initial Impression / Assessment and Plan / UC Course  I have reviewed the triage vital signs and the nursing notes.  Pertinent labs & imaging results that were available during my care of the patient were reviewed by me and considered in my medical decision making (see chart for details).   Dizziness, nausea.  Patient is well-appearing and her exam is unremarkable.  Instructed patient to change positions slowly and increase her water intake.  CBC and BMP pending.  Instructed her to follow-up with her PCP if her symptoms or not improving and to go to the emergency department if she has acute worsening symptoms.  Patient agrees to plan of care.   Final Clinical Impressions(s) / UC Diagnoses   Final diagnoses:  Dizziness  Nausea without vomiting     Discharge Instructions     Change positions slowly.  Increase your water intake to 8-10 eight ounce glasses each day.    Your blood work results will be back tomorrow.    Follow-up with your primary care provider if your symptoms are not improving.        ED Prescriptions    Medication Sig Dispense Auth. Provider   ondansetron (ZOFRAN) 4 MG tablet Take 1 tablet (4 mg total) by mouth every 6 (six) hours as needed for nausea or vomiting. 12 tablet Sharion Balloon, NP     PDMP not reviewed this encounter.   Sharion Balloon, NP 05/28/19 1610

## 2019-05-28 NOTE — ED Triage Notes (Signed)
Patient states that on Sunday she took a Meloxicam on an empty stomach. States that she ate some [place] afterwards and then laid down for a nap. States that she woke up around 4pm and felt strange with nausea and dizziness. States that she did vomit on Monday. Reports that she has continued to feel bad since Sunday but has improved some. States that she still feels off.

## 2019-05-29 LAB — CBC
Hematocrit: 30.1 % — ABNORMAL LOW (ref 34.0–46.6)
Hemoglobin: 9.3 g/dL — ABNORMAL LOW (ref 11.1–15.9)
MCH: 24 pg — ABNORMAL LOW (ref 26.6–33.0)
MCHC: 30.9 g/dL — ABNORMAL LOW (ref 31.5–35.7)
MCV: 78 fL — ABNORMAL LOW (ref 79–97)
Platelets: 383 10*3/uL (ref 150–450)
RBC: 3.88 x10E6/uL (ref 3.77–5.28)
RDW: 14.9 % (ref 11.7–15.4)
WBC: 5.2 10*3/uL (ref 3.4–10.8)

## 2019-05-29 LAB — BASIC METABOLIC PANEL
BUN/Creatinine Ratio: 11 (ref 9–23)
BUN: 9 mg/dL (ref 6–20)
CO2: 25 mmol/L (ref 20–29)
Calcium: 8.9 mg/dL (ref 8.7–10.2)
Chloride: 103 mmol/L (ref 96–106)
Creatinine, Ser: 0.81 mg/dL (ref 0.57–1.00)
GFR calc Af Amer: 106 mL/min/{1.73_m2} (ref >59)
GFR calc non Af Amer: 92 mL/min/{1.73_m2} (ref >59)
Glucose: 96 mg/dL (ref 65–99)
Potassium: 4.3 mmol/L (ref 3.5–5.2)
Sodium: 142 mmol/L (ref 134–144)

## 2019-06-02 ENCOUNTER — Telehealth: Payer: Self-pay | Admitting: Emergency Medicine

## 2019-06-02 NOTE — Telephone Encounter (Signed)
Called patient and discussed lab results.  Instructed her to follow up with her PCP.  She agrees.

## 2019-06-18 ENCOUNTER — Other Ambulatory Visit: Payer: Self-pay

## 2019-06-18 ENCOUNTER — Ambulatory Visit: Payer: BC Managed Care – PPO | Admitting: Podiatry

## 2019-06-18 ENCOUNTER — Ambulatory Visit (INDEPENDENT_AMBULATORY_CARE_PROVIDER_SITE_OTHER): Payer: BC Managed Care – PPO

## 2019-06-18 ENCOUNTER — Encounter: Payer: Self-pay | Admitting: Podiatry

## 2019-06-18 DIAGNOSIS — M1000 Idiopathic gout, unspecified site: Secondary | ICD-10-CM | POA: Diagnosis not present

## 2019-06-18 DIAGNOSIS — M778 Other enthesopathies, not elsewhere classified: Secondary | ICD-10-CM

## 2019-06-18 NOTE — Patient Instructions (Signed)

## 2019-06-21 NOTE — Progress Notes (Signed)
Subjective:   Patient ID: Beth Sanders, female   DOB: 39 y.o.   MRN: [id number]   HPI Patient presents stating that she is getting pain and stiffness in her left foot again and states that she has had several incidents that have been attributed to gout in the past and she is not sure and needs education concerning gout   ROS      Objective:  Physical Exam  Neurovascular status was found to be intact with patient having most of her discomfort now in the sinus tarsi left inflammation fluid buildup with history of discomfort around the big toe joint bilateral that is not present currently     Assessment:  Possibility for gout versus possibility for other systemic condition at a relatively young a with moderate obesity also present with inflammation fluid now worse in the sinus tarsi left     Plan:  H&P x-ray reviewed and today I did sterile prep and injected the sinus tarsi left 3 mg Kenalog 5 mg Xylocaine discussed pathology around the first MPJ and discussed gout change in diet and other things to look for.  I am sending for blood work to try to understand better what is going on with her and will see back and patient will be reevaluated and today all information on gout given to patient and discussed in great detail  X-rays left were negative for osteolysis or other indications of chronic pathology

## 2019-06-23 LAB — ANA, IFA COMPREHENSIVE PANEL
Anti Nuclear Antibody (ANA): POSITIVE — AB
ENA SM Ab Ser-aCnc: <1 AI
SM/RNP: <1 AI
SSA (Ro) (ENA) Antibody, IgG: <1 AI
SSB (La) (ENA) Antibody, IgG: <1 AI
Scleroderma (Scl-70) (ENA) Antibody, IgG: <1 AI
ds DNA Ab: <1 IU/mL

## 2019-06-23 LAB — URIC ACID: Uric Acid, Serum: 9.6 mg/dL — ABNORMAL HIGH (ref 2.5–7.0)

## 2019-06-23 LAB — ANTI-NUCLEAR AB-TITER (ANA TITER)
ANA TITER: 1:40 {titer} — ABNORMAL HIGH
ANA Titer 1: 1:40 {titer} — ABNORMAL HIGH

## 2019-06-23 LAB — RHEUMATOID FACTOR: Rheumatoid fact SerPl-aCnc: <14 IU/mL (ref <14)

## 2019-06-23 LAB — C-REACTIVE PROTEIN: CRP: 5.1 mg/L (ref <8.0)

## 2019-06-23 LAB — SEDIMENTATION RATE: Sed Rate: 45 mm/h — ABNORMAL HIGH (ref 0–20)

## 2019-07-06 ENCOUNTER — Ambulatory Visit: Payer: BC Managed Care – PPO | Admitting: Podiatry

## 2019-07-06 ENCOUNTER — Encounter: Payer: Self-pay | Admitting: Podiatry

## 2019-07-06 ENCOUNTER — Other Ambulatory Visit: Payer: Self-pay

## 2019-07-06 DIAGNOSIS — M1000 Idiopathic gout, unspecified site: Secondary | ICD-10-CM

## 2019-07-06 DIAGNOSIS — M778 Other enthesopathies, not elsewhere classified: Secondary | ICD-10-CM | POA: Diagnosis not present

## 2019-07-06 MED ORDER — ALLOPURINOL 100 MG PO TABS
100.0000 mg | ORAL_TABLET | Freq: Every day | ORAL | 6 refills | Status: DC
Start: 2019-07-06 — End: 2020-02-03

## 2019-07-08 NOTE — Progress Notes (Signed)
Subjective:   Patient ID: Beth Sanders, female   DOB: 39 y.o.   MRN: [id number]   HPI Patient presents with foot pain bilateral and states here for blood work and just seems to get pain in different areas   ROS      Objective:  Physical Exam  Neurovascular status intact with discomfort which extends into the midfoot bilateral around the big toe joint and occasionally around the Achilles tendon bilateral with pain with palpation     Assessment:  Probability based on blood work for gout and possibility for some form of systemic arthritis     Plan:  H&P x-ray reviewed with patient and at this time due to blood work we can start her on allopurinol and I want her to see her family physician for possible referral to rheumatology.  Difficult to make complete determination as to what is causing her problems but she does certainly appear to have an inflammatory condition occurring and I reviewed all those findings along with taking oral anti-inflammatories

## 2019-07-21 ENCOUNTER — Encounter: Payer: Self-pay | Admitting: Obstetrics and Gynecology

## 2019-07-21 DIAGNOSIS — E039 Hypothyroidism, unspecified: Secondary | ICD-10-CM

## 2019-07-22 ENCOUNTER — Other Ambulatory Visit: Payer: Self-pay

## 2019-07-22 ENCOUNTER — Other Ambulatory Visit: Payer: BC Managed Care – PPO

## 2019-07-22 DIAGNOSIS — E039 Hypothyroidism, unspecified: Secondary | ICD-10-CM

## 2019-07-23 ENCOUNTER — Telehealth: Payer: Self-pay | Admitting: Obstetrics and Gynecology

## 2019-07-23 DIAGNOSIS — E039 Hypothyroidism, unspecified: Secondary | ICD-10-CM

## 2019-07-23 DIAGNOSIS — E119 Type 2 diabetes mellitus without complications: Secondary | ICD-10-CM

## 2019-07-23 DIAGNOSIS — Z13220 Encounter for screening for lipoid disorders: Secondary | ICD-10-CM

## 2019-07-23 DIAGNOSIS — Z0000 Encounter for general adult medical examination without abnormal findings: Secondary | ICD-10-CM

## 2019-07-23 LAB — TSH+FREE T4
Free T4: 1.62 ng/dL (ref 0.82–1.77)
TSH: 0.253 u[IU]/mL — ABNORMAL LOW (ref 0.450–4.500)

## 2019-07-23 MED ORDER — LEVOTHYROXINE SODIUM 100 MCG PO TABS: ORAL_TABLET | ORAL | 0 refills | Status: DC

## 2019-07-23 NOTE — Telephone Encounter (Signed)
Pt aware of thyroid levels. TSH too low, normal Free T4. Pt on levo 100 mcg daily with 2 tabs on Sun. Will decrease to 1 tab daily every day. Rx RF eRxd. Rechk in 1 mo. Orders placed.   Pt also was supposed to have repeat HgA1C and lipids due to type 2 DM. On metformin BID. Wasn't done, so will check labs with thyroid in 1 mo. Has been doing diet/exercise changes.

## 2019-07-26 ENCOUNTER — Other Ambulatory Visit: Payer: Self-pay | Admitting: Podiatry

## 2019-07-27 MED ORDER — MELOXICAM 15 MG PO TABS: 15.0000 mg | ORAL_TABLET | Freq: Every day | ORAL | 0 refills | Status: DC

## 2019-07-27 NOTE — Telephone Encounter (Signed)
Valery:  Dr. Charlsie Merles did prescribe Allopurinol for gout. Her uric acid was high.  He said if she wants a refill for Meloxicam that is fine with him.  Thank you, Hannah Beat, CMA (AAMA)

## 2019-07-28 ENCOUNTER — Other Ambulatory Visit: Payer: Self-pay | Admitting: Obstetrics and Gynecology

## 2019-09-08 ENCOUNTER — Other Ambulatory Visit: Payer: Self-pay

## 2019-09-08 ENCOUNTER — Other Ambulatory Visit: Payer: BC Managed Care – PPO

## 2019-09-08 DIAGNOSIS — E039 Hypothyroidism, unspecified: Secondary | ICD-10-CM

## 2019-09-08 DIAGNOSIS — Z13220 Encounter for screening for lipoid disorders: Secondary | ICD-10-CM

## 2019-09-08 DIAGNOSIS — E119 Type 2 diabetes mellitus without complications: Secondary | ICD-10-CM

## 2019-09-08 DIAGNOSIS — Z0000 Encounter for general adult medical examination without abnormal findings: Secondary | ICD-10-CM

## 2019-09-09 ENCOUNTER — Other Ambulatory Visit: Payer: Self-pay | Admitting: Obstetrics and Gynecology

## 2019-09-09 DIAGNOSIS — E039 Hypothyroidism, unspecified: Secondary | ICD-10-CM

## 2019-09-09 DIAGNOSIS — E119 Type 2 diabetes mellitus without complications: Secondary | ICD-10-CM

## 2019-09-09 LAB — COMPREHENSIVE METABOLIC PANEL
ALT: 11 IU/L (ref 0–32)
AST: 11 IU/L (ref 0–40)
Albumin/Globulin Ratio: 1.1 — ABNORMAL LOW (ref 1.2–2.2)
Albumin: 4.1 g/dL (ref 3.8–4.8)
Alkaline Phosphatase: 63 IU/L (ref 48–121)
BUN/Creatinine Ratio: 19 (ref 9–23)
BUN: 16 mg/dL (ref 6–20)
Bilirubin Total: <0.2 mg/dL (ref 0.0–1.2)
CO2: 23 mmol/L (ref 20–29)
Calcium: 9 mg/dL (ref 8.7–10.2)
Chloride: 98 mmol/L (ref 96–106)
Creatinine, Ser: 0.86 mg/dL (ref 0.57–1.00)
GFR calc Af Amer: 98 mL/min/{1.73_m2} (ref >59)
GFR calc non Af Amer: 85 mL/min/{1.73_m2} (ref >59)
Globulin, Total: 3.8 g/dL (ref 1.5–4.5)
Glucose: 83 mg/dL (ref 65–99)
Potassium: 4.6 mmol/L (ref 3.5–5.2)
Sodium: 137 mmol/L (ref 134–144)
Total Protein: 7.9 g/dL (ref 6.0–8.5)

## 2019-09-09 LAB — LIPID PANEL
Chol/HDL Ratio: 2.9 ratio (ref 0.0–4.4)
Cholesterol, Total: 171 mg/dL (ref 100–199)
HDL: 58 mg/dL (ref >39)
LDL Chol Calc (NIH): 99 mg/dL (ref 0–99)
Triglycerides: 73 mg/dL (ref 0–149)
VLDL Cholesterol Cal: 14 mg/dL (ref 5–40)

## 2019-09-09 LAB — HEMOGLOBIN A1C
Est. average glucose Bld gHb Est-mCnc: 126 mg/dL
Hgb A1c MFr Bld: 6 % — ABNORMAL HIGH (ref 4.8–5.6)

## 2019-09-09 LAB — MICROALBUMIN / CREATININE URINE RATIO
Creatinine, Urine: 81.7 mg/dL
Microalb/Creat Ratio: 13 mg/g creat (ref 0–29)
Microalbumin, Urine: 10.3 ug/mL

## 2019-09-09 LAB — TSH+FREE T4
Free T4: 1.55 ng/dL (ref 0.82–1.77)
TSH: 4.47 u[IU]/mL (ref 0.450–4.500)

## 2019-09-09 MED ORDER — METFORMIN HCL 500 MG PO TABS: 500.0000 mg | ORAL_TABLET | Freq: Two times a day (BID) | ORAL | 1 refills | Status: DC

## 2019-09-09 MED ORDER — LISINOPRIL 5 MG PO TABS: 5.0000 mg | ORAL_TABLET | Freq: Every day | ORAL | 1 refills | Status: DC

## 2019-09-09 NOTE — Progress Notes (Signed)
Rx RF metformin and lisinopril for type 2 DM. Rechk labs in 6 months a 1/22 annual

## 2019-10-14 ENCOUNTER — Other Ambulatory Visit: Payer: Self-pay | Admitting: Obstetrics and Gynecology

## 2019-10-14 DIAGNOSIS — E039 Hypothyroidism, unspecified: Secondary | ICD-10-CM

## 2019-10-14 DIAGNOSIS — E119 Type 2 diabetes mellitus without complications: Secondary | ICD-10-CM

## 2019-10-15 ENCOUNTER — Telehealth: Payer: Self-pay | Admitting: Obstetrics and Gynecology

## 2019-10-15 NOTE — Telephone Encounter (Signed)
Spoke with pt. Her TSH levels were too low on levo 100 mcg daily with 2 tabs on Sun, but barely normal with 100 mcg daily. Pt to take 1 1/2 tabs on Sun now and will recheck labs 1/22. Rx RF. F/u prn.

## 2020-01-27 ENCOUNTER — Other Ambulatory Visit: Payer: Self-pay

## 2020-01-27 ENCOUNTER — Ambulatory Visit
Admission: RE | Admit: 2020-01-27 | Discharge: 2020-01-27 | Disposition: A | Discharge status: Home / Self Care | Payer: BC Managed Care – PPO | Source: Ambulatory Visit | Attending: Obstetrics and Gynecology | Admitting: Obstetrics and Gynecology

## 2020-01-27 DIAGNOSIS — Z1231 Encounter for screening mammogram for malignant neoplasm of breast: Secondary | ICD-10-CM | POA: Insufficient documentation

## 2020-02-02 ENCOUNTER — Encounter: Payer: Self-pay | Admitting: Podiatry

## 2020-02-02 ENCOUNTER — Other Ambulatory Visit: Payer: Self-pay | Admitting: Podiatry

## 2020-02-02 ENCOUNTER — Telehealth: Payer: Self-pay | Admitting: Podiatry

## 2020-02-02 MED ORDER — ALLOPURINOL 100 MG PO TABS: 100.0000 mg | ORAL_TABLET | Freq: Every day | ORAL | 6 refills | Status: DC

## 2020-02-02 NOTE — Telephone Encounter (Signed)
done

## 2020-02-02 NOTE — Telephone Encounter (Signed)
Please advise 

## 2020-02-02 NOTE — Addendum Note (Signed)
Addended by: Nicholes Rough on: 02/02/2020 03:35 PM   Modules accepted: Orders

## 2020-02-02 NOTE — Telephone Encounter (Signed)
Marchelle Folks asked me to send patient info over to you, Regal still in surgery and needs someone to sign off on prescription.   allopurinol (ZYLOPRIM) 100 MG tablet [[id number]]    Please Advise

## 2020-02-03 ENCOUNTER — Other Ambulatory Visit: Payer: Self-pay | Admitting: Podiatry

## 2020-02-03 MED ORDER — ALLOPURINOL 100 MG PO TABS
100.0000 mg | ORAL_TABLET | Freq: Every day | ORAL | 6 refills | Status: DC
Start: 2020-02-03 — End: 2021-05-30

## 2020-02-03 NOTE — Telephone Encounter (Signed)
done

## 2020-04-11 ENCOUNTER — Encounter: Payer: Self-pay | Admitting: Obstetrics and Gynecology

## 2020-04-11 ENCOUNTER — Other Ambulatory Visit: Payer: Self-pay | Admitting: Obstetrics and Gynecology

## 2020-04-11 DIAGNOSIS — E039 Hypothyroidism, unspecified: Secondary | ICD-10-CM

## 2020-04-11 DIAGNOSIS — E119 Type 2 diabetes mellitus without complications: Secondary | ICD-10-CM

## 2020-04-11 DIAGNOSIS — Z113 Encounter for screening for infections with a predominantly sexual mode of transmission: Secondary | ICD-10-CM

## 2020-04-11 DIAGNOSIS — Z0000 Encounter for general adult medical examination without abnormal findings: Secondary | ICD-10-CM

## 2020-04-11 MED ORDER — LEVOTHYROXINE SODIUM 100 MCG PO TABS
100.0000 ug | ORAL_TABLET | Freq: Every day | ORAL | 0 refills | Status: DC
Start: 2020-04-11 — End: 2020-05-10

## 2020-04-11 NOTE — Progress Notes (Signed)
Rx RF euthyrox. Labs due, pt to sched ASAP

## 2020-04-20 ENCOUNTER — Other Ambulatory Visit: Payer: BC Managed Care – PPO

## 2020-04-20 ENCOUNTER — Other Ambulatory Visit: Payer: Self-pay

## 2020-04-20 DIAGNOSIS — E119 Type 2 diabetes mellitus without complications: Secondary | ICD-10-CM

## 2020-04-20 DIAGNOSIS — Z113 Encounter for screening for infections with a predominantly sexual mode of transmission: Secondary | ICD-10-CM

## 2020-04-20 DIAGNOSIS — E039 Hypothyroidism, unspecified: Secondary | ICD-10-CM

## 2020-04-20 DIAGNOSIS — Z0000 Encounter for general adult medical examination without abnormal findings: Secondary | ICD-10-CM

## 2020-04-20 NOTE — Addendum Note (Signed)
Addended by: Althea Grimmer B on: 04/20/2020 09:41 AM   Modules accepted: Orders

## 2020-04-21 ENCOUNTER — Other Ambulatory Visit: Payer: Self-pay | Admitting: Obstetrics and Gynecology

## 2020-04-21 DIAGNOSIS — E119 Type 2 diabetes mellitus without complications: Secondary | ICD-10-CM

## 2020-04-21 LAB — COMPREHENSIVE METABOLIC PANEL
ALT: 11 IU/L (ref 0–32)
AST: 14 IU/L (ref 0–40)
Albumin/Globulin Ratio: 1.2 (ref 1.2–2.2)
Albumin: 4.1 g/dL (ref 3.8–4.8)
Alkaline Phosphatase: 56 IU/L (ref 44–121)
BUN/Creatinine Ratio: 17 (ref 9–23)
BUN: 16 mg/dL (ref 6–24)
Bilirubin Total: <0.2 mg/dL (ref 0.0–1.2)
CO2: 20 mmol/L (ref 20–29)
Calcium: 8.8 mg/dL (ref 8.7–10.2)
Chloride: 101 mmol/L (ref 96–106)
Creatinine, Ser: 0.95 mg/dL (ref 0.57–1.00)
Globulin, Total: 3.3 g/dL (ref 1.5–4.5)
Glucose: 86 mg/dL (ref 65–99)
Potassium: 4.4 mmol/L (ref 3.5–5.2)
Sodium: 138 mmol/L (ref 134–144)
Total Protein: 7.4 g/dL (ref 6.0–8.5)
eGFR: 78 mL/min/{1.73_m2} (ref >59)

## 2020-04-21 LAB — HEMOGLOBIN A1C
Est. average glucose Bld gHb Est-mCnc: 120 mg/dL
Hgb A1c MFr Bld: 5.8 % — ABNORMAL HIGH (ref 4.8–5.6)

## 2020-04-21 LAB — T4, FREE: Free T4: 1.55 ng/dL (ref 0.82–1.77)

## 2020-04-21 LAB — HSV 2 ANTIBODY, IGG: HSV 2 IgG, Type Spec: <0.91 index (ref 0.00–0.90)

## 2020-04-21 LAB — TSH: TSH: 0.751 u[IU]/mL (ref 0.450–4.500)

## 2020-04-21 LAB — HIV ANTIBODY (ROUTINE TESTING W REFLEX): HIV Screen 4th Generation wRfx: NONREACTIVE

## 2020-04-21 LAB — HEPATITIS C ANTIBODY: Hep C Virus Ab: <0.1 s/co ratio (ref 0.0–0.9)

## 2020-04-21 LAB — RPR: RPR Ser Ql: NONREACTIVE

## 2020-04-21 MED ORDER — METFORMIN HCL 500 MG PO TABS: 500.0000 mg | ORAL_TABLET | Freq: Two times a day (BID) | ORAL | 0 refills | Status: DC

## 2020-04-21 NOTE — Progress Notes (Signed)
Rx RF metformin. Stable HgA1 C. Annual due

## 2020-05-10 ENCOUNTER — Ambulatory Visit (INDEPENDENT_AMBULATORY_CARE_PROVIDER_SITE_OTHER): Payer: BC Managed Care – PPO | Admitting: Obstetrics and Gynecology

## 2020-05-10 ENCOUNTER — Other Ambulatory Visit: Payer: Self-pay

## 2020-05-10 ENCOUNTER — Encounter: Payer: Self-pay | Admitting: Obstetrics and Gynecology

## 2020-05-10 ENCOUNTER — Other Ambulatory Visit (HOSPITAL_COMMUNITY)
Admission: RE | Admit: 2020-05-10 | Discharge: 2020-05-10 | Disposition: A | Discharge status: Home / Self Care | Payer: BC Managed Care – PPO | Source: Ambulatory Visit | Attending: Obstetrics and Gynecology | Admitting: Obstetrics and Gynecology

## 2020-05-10 VITALS — BP 110/80 | Ht 66.0 in | Wt 172.0 lb

## 2020-05-10 DIAGNOSIS — Z1151 Encounter for screening for human papillomavirus (HPV): Secondary | ICD-10-CM | POA: Diagnosis not present

## 2020-05-10 DIAGNOSIS — Z13220 Encounter for screening for lipoid disorders: Secondary | ICD-10-CM

## 2020-05-10 DIAGNOSIS — Z9189 Other specified personal risk factors, not elsewhere classified: Secondary | ICD-10-CM

## 2020-05-10 DIAGNOSIS — E119 Type 2 diabetes mellitus without complications: Secondary | ICD-10-CM

## 2020-05-10 DIAGNOSIS — Z01419 Encounter for gynecological examination (general) (routine) without abnormal findings: Secondary | ICD-10-CM

## 2020-05-10 DIAGNOSIS — Z1231 Encounter for screening mammogram for malignant neoplasm of breast: Secondary | ICD-10-CM

## 2020-05-10 DIAGNOSIS — Z124 Encounter for screening for malignant neoplasm of cervix: Secondary | ICD-10-CM | POA: Diagnosis not present

## 2020-05-10 DIAGNOSIS — Z803 Family history of malignant neoplasm of breast: Secondary | ICD-10-CM

## 2020-05-10 DIAGNOSIS — E039 Hypothyroidism, unspecified: Secondary | ICD-10-CM

## 2020-05-10 MED ORDER — METFORMIN HCL 500 MG PO TABS: 500.0000 mg | ORAL_TABLET | Freq: Two times a day (BID) | ORAL | 0 refills | Status: DC

## 2020-05-10 MED ORDER — LISINOPRIL 5 MG PO TABS: 5.0000 mg | ORAL_TABLET | Freq: Every day | ORAL | 1 refills | Status: DC

## 2020-05-10 MED ORDER — LEVOTHYROXINE SODIUM 100 MCG PO TABS
100.0000 ug | ORAL_TABLET | Freq: Every day | ORAL | 0 refills | Status: DC
Start: 2020-05-10 — End: 2020-10-03

## 2020-05-10 NOTE — Progress Notes (Signed)
PCP:  Chad Cordial, PA-C   Chief Complaint  Patient presents with  . Gynecologic Exam    No concerns     HPI:      Beth Sanders is a 40 y.o. No obstetric history on file. who LMP was Patient's last menstrual period was 04/15/2020 (exact date)., presents today for her annual examination.  Her menses are regular every 28-30 days, lasting 4-5 days.  Dysmenorrhea none to minimal. She does not have intermenstrual bleeding.  Sex activity: single partner, contraception - condoms. Declines any other BC. May want to conceive this yr. Has new partner.   Last Pap: 09/08/15  Results were: no abnormalities /neg HPV DNA  Hx of STDs: HPV  Last mammogram: 01/27/20 Results were: normal--routine follow-up in 12 months There is a FH of breast cancer in her mom. Pt is MyRisk neg 2017. IBIS=29.6% . There is a FH of ovarian cancer in her mat aunt. The patient does do self-breast exams. She stopped taking Vit D supp. She has not had a screening breast MRI.  Tobacco use: The patient denies current or previous tobacco use. Alcohol use: none No drug use.  Exercise: not active  She does get adequate calcium but not Vitamin D in her diet.  She has a hx of type 2 DM and is on metformin 500 mg BID. HgA1C was 5.8 3/22. She is on lisinopril for kidney protection. Urine creatinine and albumin WNL 7/21. Lipids WNL 7/21. She sees opthalmology yearly. No side effects from meds.   She has hypothyroidism and is taking levo 100 mcg daily with 2 tabs Sun. TSH WNL 3/22.   Due for repeat labs 9/22. Will RF Rxs till then.   Past Medical History:  Diagnosis Date  . BRCA negative 2017   MyRisk neg  . Elevated lipids   . Family history of breast cancer   . Hypothyroid   . Increased risk of breast cancer 2017   IBIS=29.6%  . Type 2 diabetes mellitus ([place])   . Vitamin D deficiency     Past Surgical History:  Procedure Laterality Date  . WISDOM TOOTH EXTRACTION      Family History  Problem  Relation Age of Onset  . Breast cancer Mother 43  . Kidney cancer Mother 52  . Diabetes type II Father   . Ovarian cancer Maternal Aunt 59       has contact  . Colon cancer Neg Hx     Social History   Socioeconomic History  . Marital status: Single    Spouse name: Not on file  . Number of children: Not on file  . Years of education: Not on file  . Highest education level: Not on file  Occupational History  . Not on file  Tobacco Use  . Smoking status: Never Smoker  . Smokeless tobacco: Never Used  Vaping Use  . Vaping Use: Never used  Substance and Sexual Activity  . Alcohol use: Yes    Comment: socially  . Drug use: No  . Sexual activity: Yes    Partners: Male    Birth control/protection: Condom  Other Topics Concern  . Not on file  Social History Narrative  . Not on file   Social Determinants of Health   Financial Resource Strain: Not on file  Food Insecurity: Not on file  Transportation Needs: Not on file  Physical Activity: Not on file  Stress: Not on file  Social Connections: Not on file  Intimate  Partner Violence: Not on file    Current Meds  Medication Sig  . allopurinol (ZYLOPRIM) 100 MG tablet Take 1 tablet by mouth once daily  . allopurinol (ZYLOPRIM) 100 MG tablet Take 1 tablet (100 mg total) by mouth daily.  Marland Kitchen allopurinol (ZYLOPRIM) 100 MG tablet Take 1 tablet (100 mg total) by mouth daily.  . meloxicam (MOBIC) 15 MG tablet Take 1 tablet (15 mg total) by mouth daily.  . [DISCONTINUED] levothyroxine (EUTHYROX) 100 MCG tablet Take 1 tablet (100 mcg total) by mouth daily.  . [DISCONTINUED] lisinopril (ZESTRIL) 5 MG tablet Take 1 tablet by mouth once daily  . [DISCONTINUED] metFORMIN (GLUCOPHAGE) 500 MG tablet Take 1 tablet (500 mg total) by mouth 2 (two) times daily with a meal.     ROS:  Review of Systems  Constitutional: Negative for fatigue, fever and unexpected weight change.  Respiratory: Negative for cough, shortness of breath and  wheezing.   Cardiovascular: Negative for chest pain, palpitations and leg swelling.  Gastrointestinal: Negative for blood in stool, constipation, diarrhea, nausea and vomiting.  Endocrine: Negative for cold intolerance, heat intolerance and polyuria.  Genitourinary: Negative for dyspareunia, dysuria, flank pain, frequency, genital sores, hematuria, menstrual problem, pelvic pain, urgency, vaginal bleeding, vaginal discharge and vaginal pain.  Musculoskeletal: Negative for arthralgias, back pain, joint swelling and myalgias.  Skin: Negative for rash.  Neurological: Negative for dizziness, syncope, light-headedness, numbness and headaches.  Hematological: Negative for adenopathy.  Psychiatric/Behavioral: Negative for agitation, confusion, sleep disturbance and suicidal ideas. The patient is not nervous/anxious.      Objective: BP 110/80   Ht _0  (1.676 m)   Wt 172 lb (78 kg)   LMP 04/15/2020 (Exact Date)   BMI 27.76 kg/m    Physical Exam Constitutional:      Appearance: She is well-developed.  Genitourinary:     Vulva normal.     Right Labia: No rash, tenderness or lesions.    Left Labia: No tenderness, lesions or rash.    No vaginal discharge, erythema or tenderness.      Right Adnexa: not tender and no mass present.    Left Adnexa: not tender and no mass present.    No cervical motion tenderness, friability or polyp.     Uterus is not enlarged or tender.  Breasts:     Right: No mass, nipple discharge, skin change or tenderness.     Left: No mass, nipple discharge, skin change or tenderness.    Neck:     Thyroid: No thyromegaly.  Cardiovascular:     Rate and Rhythm: Normal rate and regular rhythm.     Heart sounds: Normal heart sounds. No murmur heard.   Pulmonary:     Effort: Pulmonary effort is normal.     Breath sounds: Normal breath sounds.  Abdominal:     Palpations: Abdomen is soft.     Tenderness: There is no abdominal tenderness. There is no guarding or  rebound.  Musculoskeletal:        General: Normal range of motion.     Cervical back: Normal range of motion.  Lymphadenopathy:     Cervical: No cervical adenopathy.  Neurological:     General: No focal deficit present.     Mental Status: She is alert and oriented to person, place, and time.     Cranial Nerves: No cranial nerve deficit.  Skin:    General: Skin is warm and dry.  Psychiatric:        Mood and  Affect: Mood normal.        Behavior: Behavior normal.        Thought Content: Thought content normal.        Judgment: Judgment normal.  Vitals reviewed.     Assessment/Plan: Encounter for annual routine gynecological examination  Cervical cancer screening - Plan: Cytology - PAP  Screening for HPV (human papillomavirus) - Plan: Cytology - PAP  Encounter for screening mammogram for malignant neoplasm of breast; pt current on mammo  Preconception counseling--try for 6 months and f/u for eval if no conception. Can do urine ovulation pred kit. Start PNVs and d/c lisinopril (taking for kidney protection) 3 months before conceiving.  Family history of breast cancer--Pt is MyRisk neg  Increased risk of breast cancer--cont SBE, yearly CBE and mammos. Discussed scr breast MRI. Pt to f/u by 6/22 if desires MRI. Cont ViT D supp  Type 2 diabetes mellitus without complication, without long-term current use of insulin ([place]) - Plan: Microalbumin / creatinine urine ratio, Hemoglobin A1c, Lipid panel; Rx RF metformin and lisinopril till 9/22 lab repeat.   Acquired hypothyroidism - Plan: TSH, T4, free; Rx RF levo till 9/22 lab recheck  Screening cholesterol level - Plan: Lipid panel; check labs 9/22   Meds ordered this encounter  Medications  . metFORMIN (GLUCOPHAGE) 500 MG tablet    Sig: Take 1 tablet (500 mg total) by mouth 2 (two) times daily with a meal.    Dispense:  180 tablet    Refill:  0    Order Specific Question:   Supervising Provider    Answer:   Gae Dry  [id number]  . lisinopril (ZESTRIL) 5 MG tablet    Sig: Take 1 tablet (5 mg total) by mouth daily.    Dispense:  90 tablet    Refill:  1    Order Specific Question:   Supervising Provider    Answer:   Gae Dry [id number]  . levothyroxine (EUTHYROX) 100 MCG tablet    Sig: Take 1 tablet (100 mcg total) by mouth daily.    Dispense:  90 tablet    Refill:  0    Order Specific Question:   Supervising Provider    Answer:   Gae Dry [[id number]]      GYN counsel breast self exam, mammography screening, adequate intake of calcium and vitamin D, diet and exercise     F/U  Return in about 1 year (around 05/10/2021).  Beth Vawter B. Latandra Loureiro, PA-C 05/10/2020 4:53 PM

## 2020-05-10 NOTE — Patient Instructions (Signed)
I value your feedback and you entrusting us with your care. If you get a [facility] patient survey, I would appreciate you taking the time to let us know about your experience today. Thank you! ? ? ?

## 2020-05-12 LAB — CYTOLOGY - PAP
Comment: NEGATIVE
Diagnosis: NEGATIVE
High risk HPV: NEGATIVE

## 2020-06-30 ENCOUNTER — Other Ambulatory Visit: Payer: Self-pay | Admitting: Obstetrics and Gynecology

## 2020-06-30 DIAGNOSIS — E039 Hypothyroidism, unspecified: Secondary | ICD-10-CM

## 2020-07-04 ENCOUNTER — Other Ambulatory Visit: Payer: Self-pay | Admitting: Obstetrics and Gynecology

## 2020-07-04 DIAGNOSIS — E039 Hypothyroidism, unspecified: Secondary | ICD-10-CM

## 2020-08-17 ENCOUNTER — Other Ambulatory Visit: Payer: Self-pay | Admitting: Obstetrics and Gynecology

## 2020-08-17 DIAGNOSIS — E119 Type 2 diabetes mellitus without complications: Secondary | ICD-10-CM

## 2020-09-21 ENCOUNTER — Other Ambulatory Visit: Payer: Self-pay | Admitting: Obstetrics and Gynecology

## 2020-09-21 ENCOUNTER — Encounter: Payer: Self-pay | Admitting: Obstetrics and Gynecology

## 2020-09-21 DIAGNOSIS — E039 Hypothyroidism, unspecified: Secondary | ICD-10-CM

## 2020-09-22 ENCOUNTER — Other Ambulatory Visit: Payer: Self-pay | Admitting: Podiatry

## 2020-09-22 ENCOUNTER — Other Ambulatory Visit: Payer: Self-pay | Admitting: Obstetrics and Gynecology

## 2020-09-22 DIAGNOSIS — E039 Hypothyroidism, unspecified: Secondary | ICD-10-CM

## 2020-09-23 NOTE — Telephone Encounter (Signed)
Please advise 

## 2020-09-23 NOTE — Telephone Encounter (Signed)
Patient calling to check the status of medication refill (currently has a "pending request status). Please advise

## 2020-09-27 ENCOUNTER — Telehealth: Payer: Self-pay | Admitting: *Deleted

## 2020-09-27 NOTE — Telephone Encounter (Signed)
Patient is calling to request a medication refill but has not been seen since 02/03/20. Please schedule for an upcoming appointment.

## 2020-09-28 NOTE — Telephone Encounter (Signed)
Please advise 

## 2020-09-29 ENCOUNTER — Ambulatory Visit: Payer: BC Managed Care – PPO | Admitting: Podiatry

## 2020-09-29 ENCOUNTER — Encounter: Payer: Self-pay | Admitting: Podiatry

## 2020-09-29 ENCOUNTER — Other Ambulatory Visit: Payer: Self-pay

## 2020-09-29 DIAGNOSIS — M1000 Idiopathic gout, unspecified site: Secondary | ICD-10-CM

## 2020-09-29 DIAGNOSIS — M778 Other enthesopathies, not elsewhere classified: Secondary | ICD-10-CM

## 2020-09-29 MED ORDER — ALLOPURINOL 100 MG PO TABS: 100.0000 mg | ORAL_TABLET | Freq: Every day | ORAL | 6 refills | Status: AC

## 2020-09-29 NOTE — Progress Notes (Signed)
Subjective:   Patient ID: Beth Sanders, female   DOB: 40 y.o.   MRN: [id number]   HPI Patient presents stating she wants a refill of her allopurinol as its been very effective and she is thinking about getting pregnant   ROS      Objective:  Physical Exam  Neurovascular status intact with history of chronic gout attacks of the been under control with allopurinol     Assessment:  Chronic gout     Plan:  Reviewed condition I want her to follow-up with her OB and I did write her for medicine temporarily if she is not pregnant but I do want her to follow-up with that as I do not recommend she takes it while pregnant but she will follow-up with her internist and with her OB/GYN

## 2020-09-30 ENCOUNTER — Other Ambulatory Visit: Payer: BC Managed Care – PPO

## 2020-09-30 DIAGNOSIS — E039 Hypothyroidism, unspecified: Secondary | ICD-10-CM

## 2020-09-30 DIAGNOSIS — Z13220 Encounter for screening for lipoid disorders: Secondary | ICD-10-CM

## 2020-09-30 DIAGNOSIS — E119 Type 2 diabetes mellitus without complications: Secondary | ICD-10-CM

## 2020-10-01 LAB — T4, FREE: Free T4: 1.34 ng/dL (ref 0.82–1.77)

## 2020-10-01 LAB — LIPID PANEL
Chol/HDL Ratio: 2.6 ratio (ref 0.0–4.4)
Cholesterol, Total: 171 mg/dL (ref 100–199)
HDL: 67 mg/dL (ref >39)
LDL Chol Calc (NIH): 84 mg/dL (ref 0–99)
Triglycerides: 116 mg/dL (ref 0–149)
VLDL Cholesterol Cal: 20 mg/dL (ref 5–40)

## 2020-10-01 LAB — TSH: TSH: 3.44 u[IU]/mL (ref 0.450–4.500)

## 2020-10-01 LAB — HEMOGLOBIN A1C
Est. average glucose Bld gHb Est-mCnc: 126 mg/dL
Hgb A1c MFr Bld: 6 % — ABNORMAL HIGH (ref 4.8–5.6)

## 2020-10-03 ENCOUNTER — Other Ambulatory Visit: Payer: Self-pay | Admitting: Obstetrics and Gynecology

## 2020-10-03 DIAGNOSIS — E039 Hypothyroidism, unspecified: Secondary | ICD-10-CM

## 2020-10-03 DIAGNOSIS — E119 Type 2 diabetes mellitus without complications: Secondary | ICD-10-CM

## 2020-10-03 MED ORDER — LISINOPRIL 5 MG PO TABS: 5.0000 mg | ORAL_TABLET | Freq: Every day | ORAL | 1 refills | Status: DC

## 2020-10-03 MED ORDER — LEVOTHYROXINE SODIUM 100 MCG PO TABS: 100.0000 ug | ORAL_TABLET | Freq: Every day | ORAL | 1 refills | Status: DC

## 2020-10-03 MED ORDER — METFORMIN HCL 500 MG PO TABS: 500.0000 mg | ORAL_TABLET | Freq: Two times a day (BID) | ORAL | 1 refills | Status: DC

## 2020-10-03 NOTE — Progress Notes (Signed)
Rx RF meds for DM and hypothyroidism. Recent labs ok. Labs due 3/23.  Meds ordered this encounter  Medications   levothyroxine (EUTHYROX) 100 MCG tablet    Sig: Take 1 tablet (100 mcg total) by mouth daily.    Dispense:  90 tablet    Refill:  1    Order Specific Question:   Supervising Provider    Answer:   Nadara Mustard [[id number]]   lisinopril (ZESTRIL) 5 MG tablet    Sig: Take 1 tablet (5 mg total) by mouth daily.    Dispense:  90 tablet    Refill:  1    Order Specific Question:   Supervising Provider    Answer:   Nadara Mustard [[id number]]   metFORMIN (GLUCOPHAGE) 500 MG tablet    Sig: Take 1 tablet (500 mg total) by mouth 2 (two) times daily with a meal.    Dispense:  180 tablet    Refill:  1    Order Specific Question:   Supervising Provider    Answer:   Nadara Mustard [phone]

## 2020-11-04 ENCOUNTER — Encounter: Payer: Self-pay | Admitting: Emergency Medicine

## 2020-11-04 ENCOUNTER — Other Ambulatory Visit: Payer: Self-pay

## 2020-11-04 ENCOUNTER — Ambulatory Visit
Admission: EM | Admit: 2020-11-04 | Discharge: 2020-11-04 | Disposition: A | Discharge status: Home / Self Care | Payer: BC Managed Care – PPO

## 2020-11-04 DIAGNOSIS — K529 Noninfective gastroenteritis and colitis, unspecified: Secondary | ICD-10-CM

## 2020-11-04 NOTE — Discharge Instructions (Addendum)
Keep yourself hydrated with clear liquids, such as water, Gatorade, Pedialyte, Sprite, or ginger ale.    Go to the emergency department if you have acute worsening symptoms.    Follow up with your primary care provider if your symptoms are not improving.      

## 2020-11-04 NOTE — ED Provider Notes (Signed)
Roderic Palau    CSN: [id number] Arrival date & time: 11/04/20  0865      History   Chief Complaint Chief Complaint  Patient presents with   Diarrhea    HPI Beth Sanders is a 40 y.o. female.  Patient presents with diarrhea x3 days.  She reports 3 episodes of diarrhea today.  She vomited twice yesterday but none today.  She had a fever earlier in the week but this has resolved.  She denies abdominal pain, rash, cough, SOB, or other symptoms.  1 dose of Pepto-Bismol taken yesterday; none today.  She states she is able to keep herself hydrated at home.  No recent travel or antibiotics.  Her medical history includes diabetes and hypothyroidism.  She denies pregnancy or breast-feeding.  LMP 10/11/2020.  The history is provided by the patient and medical records.   Past Medical History:  Diagnosis Date   BRCA negative 2017   MyRisk neg   Elevated lipids    Family history of breast cancer    Hypothyroid    Increased risk of breast cancer 2017   IBIS=29.6%   Type 2 diabetes mellitus ([place])    Vitamin D deficiency     Patient Active Problem List   Diagnosis Date Noted   Increased risk of breast cancer 10/30/2016   Hypothyroidism (acquired) 08/20/2016   Type 2 diabetes mellitus ([place]) 08/20/2016    Past Surgical History:  Procedure Laterality Date   WISDOM TOOTH EXTRACTION      OB History     Gravida  0   Para  0   Term  0   Preterm  0   AB  0   Living  0      SAB  0   IAB  0   Ectopic  0   Multiple  0   Live Births  0            Home Medications    Prior to Admission medications   Medication Sig Start Date End Date Taking? Authorizing Provider  allopurinol (ZYLOPRIM) 100 MG tablet Take 1 tablet by mouth once daily 02/03/20  Yes Regal, Tamala Fothergill, DPM  levothyroxine (EUTHYROX) 100 MCG tablet Take 1 tablet (100 mcg total) by mouth daily. 7/84/69  Yes Copland, Elmo Putt B, PA-C  lisinopril (ZESTRIL) 5 MG tablet Take 1 tablet (5 mg total)  by mouth daily. 08/11/50  Yes Copland, Deirdre Evener, PA-C  metFORMIN (GLUCOPHAGE) 500 MG tablet Take 1 tablet (500 mg total) by mouth 2 (two) times daily with a meal. 8/41/32  Yes Copland, Alicia B, PA-C  allopurinol (ZYLOPRIM) 100 MG tablet Take 1 tablet (100 mg total) by mouth daily. 02/03/20   Wallene Huh, DPM  allopurinol (ZYLOPRIM) 100 MG tablet Take 1 tablet by mouth once daily 09/27/20   Felipa Furnace, DPM  allopurinol (ZYLOPRIM) 100 MG tablet Take 1 tablet (100 mg total) by mouth daily. 09/29/20   Wallene Huh, DPM  clindamycin (CLEOCIN T) 1 % external solution Apply to groin area 3-4 times per week as needed. Patient not taking: Reported on 05/10/2020 03/17/20   [provider]  meloxicam (MOBIC) 15 MG tablet Take 1 tablet (15 mg total) by mouth daily. 07/27/19   Wallene Huh, DPM    Family History Family History  Problem Relation Age of Onset   Breast cancer Mother 44   Kidney cancer Mother 87   Diabetes type II Father    Ovarian cancer Maternal  Aunt 59       has contact   Colon cancer Neg Hx     Social History Social History   Tobacco Use   Smoking status: Never   Smokeless tobacco: Never  Vaping Use   Vaping Use: Never used  Substance Use Topics   Alcohol use: Yes    Comment: socially   Drug use: No     Allergies   Patient has no known allergies.   Review of Systems Review of Systems  Constitutional:  Negative for chills and fever.  HENT:  Negative for ear pain and sore throat.   Respiratory:  Negative for cough and shortness of breath.   Cardiovascular:  Negative for chest pain and palpitations.  Gastrointestinal:  Positive for diarrhea and vomiting. Negative for abdominal pain.  Genitourinary:  Negative for dysuria and hematuria.  Skin:  Negative for color change and rash.  All other systems reviewed and are negative.   Physical Exam Triage Vital Signs ED Triage Vitals  Enc Vitals Group     BP 11/04/20 1026 121/80     Pulse Rate 11/04/20  1026 88     Resp --      Temp 11/04/20 1026 99.6 F (37.6 C)     Temp Source 11/04/20 1026 Oral     SpO2 11/04/20 1026 100 %     Weight 11/04/20 1021 171 lb (77.6 kg)     Height 11/04/20 1023 _0  (1.676 m)     Head Circumference --      Peak Flow --      Pain Score 11/04/20 1023 0     Pain Loc --      Pain Edu? --      Excl. in [place]? --    No data found.  Updated Vital Signs BP 121/80 (BP Location: Left Arm)   Pulse 88   Temp 99.6 F (37.6 C) (Oral)   Ht _1  (1.676 m)   Wt 171 lb (77.6 kg)   LMP 10/11/2020 (Exact Date)   SpO2 100%   BMI 27.60 kg/m   Visual Acuity Right Eye Distance:   Left Eye Distance:   Bilateral Distance:    Right Eye Near:   Left Eye Near:    Bilateral Near:     Physical Exam Vitals and nursing note reviewed.  Constitutional:      General: She is not in acute distress.    Appearance: She is well-developed. She is not ill-appearing.  HENT:     Head: Normocephalic and atraumatic.     Right Ear: Tympanic membrane normal.     Left Ear: Tympanic membrane normal.     Nose: Nose normal.     Mouth/Throat:     Mouth: Mucous membranes are moist.     Pharynx: Oropharynx is clear.  Eyes:     Conjunctiva/sclera: Conjunctivae normal.  Cardiovascular:     Rate and Rhythm: Normal rate and regular rhythm.     Heart sounds: Normal heart sounds.  Pulmonary:     Effort: Pulmonary effort is normal. No respiratory distress.     Breath sounds: Normal breath sounds.  Abdominal:     General: Bowel sounds are normal.     Palpations: Abdomen is soft.     Tenderness: There is no abdominal tenderness. There is no guarding or rebound.  Musculoskeletal:     Cervical back: Neck supple.  Skin:    General: Skin is warm and dry.  Neurological:  General: No focal deficit present.     Mental Status: She is alert and oriented to person, place, and time.     Gait: Gait normal.  Psychiatric:        Mood and Affect: Mood normal.        Behavior: Behavior  normal.     UC Treatments / Results  Labs (all labs ordered are listed, but only abnormal results are displayed) Labs Reviewed - No data to display  EKG   Radiology No results found.  Procedures Procedures (including critical care time)  Medications Ordered in UC Medications - No data to display  Initial Impression / Assessment and Plan / UC Course  I have reviewed the triage vital signs and the nursing notes.  Pertinent labs & imaging results that were available during my care of the patient were reviewed by me and considered in my medical decision making (see chart for details).  Viral gastroenteritis.  Patient appears well-hydrated, vital signs are stable.  Discussed symptomatic treatment including oral hydration with clear liquids, BRAT diet, Pepto-Bismol or Imodium.  Patient declined antinausea medication.  ED precautions discussed.  Instructed her to follow-up with her PCP if her symptoms are not improving.  She agrees to plan of care.   Final Clinical Impressions(s) / UC Diagnoses   Final diagnoses:  Gastroenteritis     Discharge Instructions      Keep yourself hydrated with clear liquids, such as water, Gatorade, Pedialyte, Sprite, or ginger ale.    Go to the emergency department if you have acute worsening symptoms.    Follow up with your primary care provider if your symptoms are not improving.          ED Prescriptions   None    PDMP not reviewed this encounter.   Sharion Balloon, NP 11/04/20 1043

## 2020-11-04 NOTE — ED Triage Notes (Signed)
Pt c/o watery diarrhea x 3 days. 4 days ago pt states flu like symptoms started that has since resolved.

## 2021-01-10 ENCOUNTER — Encounter: Payer: Self-pay | Admitting: Obstetrics and Gynecology

## 2021-01-10 NOTE — Telephone Encounter (Signed)
Called pharmacy, spoke to [employer], verbal taken of approval: levothyroxine 100 mcg tab #90. Okay to switch manufacturer per [employer].

## 2021-01-10 NOTE — Telephone Encounter (Signed)
Melissa @[facility] [address] calling. States patient gets her Thyroid meds there. The last strength she got was in August. Patient believes it's supposed to be September. Also, they need approval to change manufacturers as well. 

## 2021-04-17 ENCOUNTER — Other Ambulatory Visit: Payer: Self-pay | Admitting: Obstetrics and Gynecology

## 2021-04-17 DIAGNOSIS — E039 Hypothyroidism, unspecified: Secondary | ICD-10-CM

## 2021-04-19 ENCOUNTER — Encounter: Payer: Self-pay | Admitting: Obstetrics and Gynecology

## 2021-04-22 ENCOUNTER — Other Ambulatory Visit: Payer: Self-pay | Admitting: Obstetrics and Gynecology

## 2021-04-22 DIAGNOSIS — E039 Hypothyroidism, unspecified: Secondary | ICD-10-CM

## 2021-04-22 MED ORDER — LEVOTHYROXINE SODIUM 100 MCG PO TABS: 100.0000 ug | ORAL_TABLET | Freq: Every day | ORAL | 0 refills | Status: DC

## 2021-04-22 NOTE — Progress Notes (Signed)
Rx RF till 4/23 annual ?

## 2021-05-30 ENCOUNTER — Encounter: Payer: Self-pay | Admitting: Obstetrics and Gynecology

## 2021-05-30 ENCOUNTER — Ambulatory Visit (INDEPENDENT_AMBULATORY_CARE_PROVIDER_SITE_OTHER): Payer: BC Managed Care – PPO | Admitting: Obstetrics and Gynecology

## 2021-05-30 VITALS — BP 100/60 | Ht 66.0 in | Wt 176.0 lb

## 2021-05-30 DIAGNOSIS — E039 Hypothyroidism, unspecified: Secondary | ICD-10-CM

## 2021-05-30 DIAGNOSIS — Z13220 Encounter for screening for lipoid disorders: Secondary | ICD-10-CM

## 2021-05-30 DIAGNOSIS — Z9189 Other specified personal risk factors, not elsewhere classified: Secondary | ICD-10-CM

## 2021-05-30 DIAGNOSIS — Z0000 Encounter for general adult medical examination without abnormal findings: Secondary | ICD-10-CM

## 2021-05-30 DIAGNOSIS — Z1231 Encounter for screening mammogram for malignant neoplasm of breast: Secondary | ICD-10-CM | POA: Diagnosis not present

## 2021-05-30 DIAGNOSIS — E119 Type 2 diabetes mellitus without complications: Secondary | ICD-10-CM

## 2021-05-30 DIAGNOSIS — Z01419 Encounter for gynecological examination (general) (routine) without abnormal findings: Secondary | ICD-10-CM

## 2021-05-30 DIAGNOSIS — Z3169 Encounter for other general counseling and advice on procreation: Secondary | ICD-10-CM

## 2021-05-30 DIAGNOSIS — Z803 Family history of malignant neoplasm of breast: Secondary | ICD-10-CM

## 2021-05-30 NOTE — Patient Instructions (Signed)
I value your feedback and you entrusting us with your care. If you get a [facility] patient survey, I would appreciate you taking the time to let us know about your experience today. Thank you!  [facility]: [phone]      

## 2021-05-30 NOTE — Progress Notes (Signed)
? ?PCP:  Copland, Alicia B, PA-C ? ? ?Chief Complaint  ?Patient presents with  ? Gynecologic Exam  ?  No concerns  ? ? ? ?HPI: ?     Beth Sanders is a 41 y.o. [id number]  who LMP was Patient's last menstrual period was 05/19/2021 (exact date)., presents today for her annual examination.  Her menses are regular every 24-33 days, lasting 5 days.  Dysmenorrhea none to minimal. She does not have intermenstrual bleeding. ? ?Sex activity: single partner, contraception - none, hasn't conceived in over a year with current partner/fiance. No hx of PID/gon/chlam. Partner without children, no hx of testicular trauma/surgery. Hasn't done urine ovulation pred kit; partner without semen analysis in past. ? ?Last Pap: 05/10/20  Results were: no abnormalities /neg HPV DNA  ?Hx of STDs: HPV ? ?Last mammogram: 01/27/20 Results were: normal--routine follow-up in 12 months ?There is a FH of breast cancer in her mom. Pt is MyRisk neg 2017. IBIS=29.6% . There is a FH of ovarian cancer in her mat aunt. The patient does self-breast exams. She stopped taking Vit D supp. She has not had a screening breast MRI. ? ?Tobacco use: The patient denies current or previous tobacco use. ?Alcohol use: none ?No drug use.  ?Exercise: not active ? ?She does get adequate calcium but not Vitamin D in her diet. ? ?She has a hx of type 2 DM and is on metformin 500 mg BID, but pt only taking it QD and doing diet/wt loss changes. HgA1C was 6.0 8/22. She is on lisinopril for kidney protection. Urine creatinine and albumin WNL 7/21. Lipids WNL 7/21. She sees opthalmology yearly. No side effects from meds.  ? ?She has hypothyroidism and is taking levo 100 mcg daily since 6/21. TSH WNL 8/22.  ? ?Due for repeat labs now. Will RF Rxs with results.  ? ?Pt diagnosed with gout this yr and taking allopurinol. Was doing daily but has decreased to once wkly since wants to conceive.  ? ?Past Medical History:  ?Diagnosis Date  ? BRCA negative 2017  ? MyRisk neg  ?  Elevated lipids   ? Family history of breast cancer   ? Hypothyroid   ? Increased risk of breast cancer 2017  ? IBIS=29.6%  ? Type 2 diabetes mellitus (HCC)   ? Vitamin D deficiency   ? ? ?Past Surgical History:  ?Procedure Laterality Date  ? WISDOM TOOTH EXTRACTION    ? ? ?Family History  ?Problem Relation Age of Onset  ? Breast cancer Mother 35  ? Kidney cancer Mother 52  ? Diabetes type II Father   ? Ovarian cancer Maternal Aunt 59  ?     has contact  ? Colon cancer Neg Hx   ? ? ?Social History  ? ?Socioeconomic History  ? Marital status: Single  ?  Spouse name: Not on file  ? Number of children: Not on file  ? Years of education: Not on file  ? Highest education level: Not on file  ?Occupational History  ? Not on file  ?Tobacco Use  ? Smoking status: Never  ? Smokeless tobacco: Never  ?Vaping Use  ? Vaping Use: Never used  ?Substance and Sexual Activity  ? Alcohol use: Yes  ?  Comment: socially  ? Drug use: No  ? Sexual activity: Yes  ?  Partners: Male  ?  Birth control/protection: None  ?Other Topics Concern  ? Not on file  ?Social History Narrative  ? Not on   file  ? ?Social Determinants of Health  ? ?Financial Resource Strain: Not on file  ?Food Insecurity: Not on file  ?Transportation Needs: Not on file  ?Physical Activity: Not on file  ?Stress: Not on file  ?Social Connections: Not on file  ?Intimate Partner Violence: Not on file  ? ? ?Current Meds  ?Medication Sig  ? allopurinol (ZYLOPRIM) 100 MG tablet Take 1 tablet (100 mg total) by mouth daily.  ? levothyroxine (EUTHYROX) 100 MCG tablet Take 1 tablet (100 mcg total) by mouth daily.  ? lisinopril (ZESTRIL) 5 MG tablet Take 1 tablet (5 mg total) by mouth daily.  ? metFORMIN (GLUCOPHAGE) 500 MG tablet Take 1 tablet (500 mg total) by mouth 2 (two) times daily with a meal.  ? ? ? ?ROS: ? ?Review of Systems  ?Constitutional:  Negative for fatigue, fever and unexpected weight change.  ?Respiratory:  Negative for cough, shortness of breath and wheezing.    ?Cardiovascular:  Negative for chest pain, palpitations and leg swelling.  ?Gastrointestinal:  Negative for blood in stool, constipation, diarrhea, nausea and vomiting.  ?Endocrine: Negative for cold intolerance, heat intolerance and polyuria.  ?Genitourinary:  Negative for dyspareunia, dysuria, flank pain, frequency, genital sores, hematuria, menstrual problem, pelvic pain, urgency, vaginal bleeding, vaginal discharge and vaginal pain.  ?Musculoskeletal:  Negative for arthralgias, back pain, joint swelling and myalgias.  ?Skin:  Negative for rash.  ?Neurological:  Negative for dizziness, syncope, light-headedness, numbness and headaches.  ?Hematological:  Negative for adenopathy.  ?Psychiatric/Behavioral:  Negative for agitation, confusion, sleep disturbance and suicidal ideas. The patient is not nervous/anxious.   ? ? ?Objective: ?BP 100/60   Ht 5' 6" (1.676 m)   Wt 176 lb (79.8 kg)   LMP 05/19/2021 (Exact Date)   BMI 28.41 kg/m?  ? ? ?Physical Exam ?Constitutional:   ?   Appearance: She is well-developed.  ?Genitourinary:  ?   Vulva normal.  ?   Right Labia: No rash, tenderness or lesions. ?   Left Labia: No tenderness, lesions or rash. ?   No vaginal discharge, erythema or tenderness.  ? ?   Right Adnexa: not tender and no mass present. ?   Left Adnexa: not tender and no mass present. ?   No cervical motion tenderness, friability or polyp.  ?   Uterus is not enlarged or tender.  ?Breasts: ?   Right: No mass, nipple discharge, skin change or tenderness.  ?   Left: No mass, nipple discharge, skin change or tenderness.  ?Neck:  ?   Thyroid: No thyromegaly.  ?Cardiovascular:  ?   Rate and Rhythm: Normal rate and regular rhythm.  ?   Heart sounds: Normal heart sounds. No murmur heard. ?Pulmonary:  ?   Effort: Pulmonary effort is normal.  ?   Breath sounds: Normal breath sounds.  ?Abdominal:  ?   Palpations: Abdomen is soft.  ?   Tenderness: There is no abdominal tenderness. There is no guarding or rebound.   ?Musculoskeletal:     ?   General: Normal range of motion.  ?   Cervical back: Normal range of motion.  ?Lymphadenopathy:  ?   Cervical: No cervical adenopathy.  ?Neurological:  ?   General: No focal deficit present.  ?   Mental Status: She is alert and oriented to person, place, and time.  ?   Cranial Nerves: No cranial nerve deficit.  ?Skin: ?   General: Skin is warm and dry.  ?Psychiatric:     ?     Mood and Affect: Mood normal.     ?   Behavior: Behavior normal.     ?   Thought Content: Thought content normal.     ?   Judgment: Judgment normal.  ?Vitals reviewed.  ? ? ?Assessment/Plan: ?Encounter for annual routine gynecological examination ? ?Encounter for screening mammogram for malignant neoplasm of breast - Plan: MM 3D SCREEN BREAST BILATERAL; pt to schedule mammo with menses so not pregnant. ? ?Family history of breast cancer--pt is MyRisk neg ? ?Increased risk of breast cancer--cont SBE, yearly CBE and mammos. Discussed scr breast MRI. Resume Vit D supp ? ?Hypothyroidism (acquired) - Plan: TSH, T4, free ? ?Type 2 diabetes mellitus without complication, without long-term current use of insulin (HCC) - Plan: Lipid panel, Hemoglobin A1c, Microalbumin / creatinine urine ratio ? ?Blood tests for routine general physical examination - Plan: Comprehensive metabolic panel, Lipid panel, Hemoglobin A1c, TSH, T4, free ? ?Screening cholesterol level - Plan: Lipid panel ? ?Preconception counseling--try for 6 months and f/u for eval if no conception. Can do urine ovulation pred kit. Start PNVs and d/c lisinopril (taking for kidney protection) 3 months before conceiving. ? ?Infertility counseling--pt's menses Q24-33 days. Pt to try urine ovulation pred fit and can go ahead and do semen analysis. Pt to discuss with fiance and f/u for order prn. Start PNVs, d/c lisinopril and allopurinol when pregnant. Also discussed HSG.  ? ?  ?GYN counsel breast self exam, mammography screening, adequate intake of calcium and vitamin D,  diet and exercise ? ? ?  F/U ? Return in about 1 year (around 05/31/2022). ? ?Alicia B. Copland, PA-C ?05/30/2021 ?4:59 PM ?

## 2021-06-01 ENCOUNTER — Other Ambulatory Visit: Payer: BC Managed Care – PPO

## 2021-06-01 DIAGNOSIS — E039 Hypothyroidism, unspecified: Secondary | ICD-10-CM

## 2021-06-01 DIAGNOSIS — Z0000 Encounter for general adult medical examination without abnormal findings: Secondary | ICD-10-CM

## 2021-06-01 DIAGNOSIS — E119 Type 2 diabetes mellitus without complications: Secondary | ICD-10-CM

## 2021-06-01 DIAGNOSIS — Z13220 Encounter for screening for lipoid disorders: Secondary | ICD-10-CM

## 2021-06-02 LAB — MICROALBUMIN / CREATININE URINE RATIO
Creatinine, Urine: 216 mg/dL
Microalb/Creat Ratio: 12 mg/g creat (ref 0–29)
Microalbumin, Urine: 25.3 ug/mL

## 2021-06-02 LAB — COMPREHENSIVE METABOLIC PANEL
ALT: 17 IU/L (ref 0–32)
AST: 23 IU/L (ref 0–40)
Albumin/Globulin Ratio: 1.2 (ref 1.2–2.2)
Albumin: 4.2 g/dL (ref 3.8–4.8)
Alkaline Phosphatase: 54 IU/L (ref 44–121)
BUN/Creatinine Ratio: 13 (ref 9–23)
BUN: 12 mg/dL (ref 6–24)
Bilirubin Total: <0.2 mg/dL (ref 0.0–1.2)
CO2: 20 mmol/L (ref 20–29)
Calcium: 8.9 mg/dL (ref 8.7–10.2)
Chloride: 104 mmol/L (ref 96–106)
Creatinine, Ser: 0.89 mg/dL (ref 0.57–1.00)
Globulin, Total: 3.5 g/dL (ref 1.5–4.5)
Glucose: 93 mg/dL (ref 70–99)
Potassium: 4.5 mmol/L (ref 3.5–5.2)
Sodium: 139 mmol/L (ref 134–144)
Total Protein: 7.7 g/dL (ref 6.0–8.5)
eGFR: 83 mL/min/{1.73_m2} (ref >59)

## 2021-06-02 LAB — LIPID PANEL
Chol/HDL Ratio: 2.4 ratio (ref 0.0–4.4)
Cholesterol, Total: 166 mg/dL (ref 100–199)
HDL: 69 mg/dL (ref >39)
LDL Chol Calc (NIH): 85 mg/dL (ref 0–99)
Triglycerides: 61 mg/dL (ref 0–149)
VLDL Cholesterol Cal: 12 mg/dL (ref 5–40)

## 2021-06-02 LAB — TSH: TSH: 3.47 u[IU]/mL (ref 0.450–4.500)

## 2021-06-02 LAB — HEMOGLOBIN A1C
Est. average glucose Bld gHb Est-mCnc: 123 mg/dL
Hgb A1c MFr Bld: 5.9 % — ABNORMAL HIGH (ref 4.8–5.6)

## 2021-06-02 LAB — T4, FREE: Free T4: 1.82 ng/dL — ABNORMAL HIGH (ref 0.82–1.77)

## 2021-06-07 ENCOUNTER — Encounter: Payer: Self-pay | Admitting: Obstetrics and Gynecology

## 2021-06-20 ENCOUNTER — Other Ambulatory Visit: Payer: Self-pay | Admitting: Obstetrics and Gynecology

## 2021-06-20 DIAGNOSIS — E119 Type 2 diabetes mellitus without complications: Secondary | ICD-10-CM

## 2021-06-20 MED ORDER — LISINOPRIL 5 MG PO TABS: 5.0000 mg | ORAL_TABLET | Freq: Every day | ORAL | 3 refills | Status: AC

## 2021-06-20 NOTE — Progress Notes (Signed)
Rx RF lisinopril for kidney protection due to DM.  ?

## 2021-07-05 ENCOUNTER — Ambulatory Visit
Admission: RE | Admit: 2021-07-05 | Discharge: 2021-07-05 | Disposition: A | Discharge status: Home / Self Care | Payer: BC Managed Care – PPO | Source: Ambulatory Visit | Attending: Obstetrics and Gynecology | Admitting: Obstetrics and Gynecology

## 2021-07-05 DIAGNOSIS — Z1231 Encounter for screening mammogram for malignant neoplasm of breast: Secondary | ICD-10-CM | POA: Insufficient documentation

## 2021-07-23 ENCOUNTER — Other Ambulatory Visit: Payer: Self-pay | Admitting: Obstetrics and Gynecology

## 2021-07-23 DIAGNOSIS — E039 Hypothyroidism, unspecified: Secondary | ICD-10-CM

## 2021-07-26 ENCOUNTER — Encounter: Payer: Self-pay | Admitting: Obstetrics and Gynecology

## 2021-07-26 ENCOUNTER — Other Ambulatory Visit: Payer: Self-pay | Admitting: Obstetrics and Gynecology

## 2021-07-26 DIAGNOSIS — E039 Hypothyroidism, unspecified: Secondary | ICD-10-CM

## 2021-07-27 ENCOUNTER — Other Ambulatory Visit: Payer: Self-pay

## 2021-07-27 ENCOUNTER — Other Ambulatory Visit: Payer: BC Managed Care – PPO

## 2021-07-27 DIAGNOSIS — E039 Hypothyroidism, unspecified: Secondary | ICD-10-CM

## 2021-07-28 LAB — T4, FREE: Free T4: 1.3 ng/dL (ref 0.82–1.77)

## 2021-07-28 LAB — TSH: TSH: 4.79 u[IU]/mL — ABNORMAL HIGH (ref 0.450–4.500)

## 2021-07-29 ENCOUNTER — Other Ambulatory Visit: Payer: Self-pay | Admitting: Obstetrics and Gynecology

## 2021-07-29 DIAGNOSIS — E039 Hypothyroidism, unspecified: Secondary | ICD-10-CM

## 2021-07-29 MED ORDER — LEVOTHYROXINE SODIUM 100 MCG PO TABS: 100.0000 ug | ORAL_TABLET | Freq: Every day | ORAL | 0 refills | Status: DC

## 2021-07-29 NOTE — Progress Notes (Signed)
Rx levo dose change to 100 mcg daily except 2 tabs on Sun. Recheck labs in 1 mo.

## 2021-08-23 ENCOUNTER — Other Ambulatory Visit: Payer: Self-pay | Admitting: Obstetrics and Gynecology

## 2021-08-23 DIAGNOSIS — E039 Hypothyroidism, unspecified: Secondary | ICD-10-CM

## 2021-08-31 ENCOUNTER — Other Ambulatory Visit: Payer: BC Managed Care – PPO

## 2021-08-31 DIAGNOSIS — E039 Hypothyroidism, unspecified: Secondary | ICD-10-CM

## 2021-08-31 LAB — HM DIABETES EYE EXAM

## 2021-09-01 LAB — T4, FREE: Free T4: 1.71 ng/dL (ref 0.82–1.77)

## 2021-09-01 LAB — TSH: TSH: 1.19 u[IU]/mL (ref 0.450–4.500)

## 2021-09-02 ENCOUNTER — Other Ambulatory Visit: Payer: Self-pay | Admitting: Obstetrics and Gynecology

## 2021-09-02 DIAGNOSIS — E039 Hypothyroidism, unspecified: Secondary | ICD-10-CM

## 2021-09-02 MED ORDER — LEVOTHYROXINE SODIUM 100 MCG PO TABS: 100.0000 ug | ORAL_TABLET | Freq: Every day | ORAL | 1 refills | Status: AC

## 2021-09-02 NOTE — Progress Notes (Signed)
Rx RF levo 100 mcg daily except 2 tabs on Sun, repeat labs in 6 montths

## 2021-11-13 ENCOUNTER — Other Ambulatory Visit: Payer: Self-pay | Admitting: Obstetrics and Gynecology

## 2021-11-13 ENCOUNTER — Other Ambulatory Visit: Payer: Self-pay | Admitting: Podiatry

## 2021-11-13 DIAGNOSIS — E119 Type 2 diabetes mellitus without complications: Secondary | ICD-10-CM

## 2021-11-14 ENCOUNTER — Other Ambulatory Visit: Payer: Self-pay | Admitting: Obstetrics and Gynecology

## 2022-08-17 IMAGING — MG MM DIGITAL SCREENING BILAT W/ TOMO AND CAD
6 of 10 series · 6 of 30 positions shown · non-contrast
Comparison: Previous exam(s).

CLINICAL DATA: Screening.

EXAM:
DIGITAL SCREENING BILATERAL MAMMOGRAM WITH TOMOSYNTHESIS AND CAD
TECHNIQUE: Bilateral screening digital craniocaudal and mediolateral oblique
mammograms were obtained. Bilateral screening digital breast
tomosynthesis was performed. The images were evaluated with
computer-aided detection.

[L MLO synth-2D (1 of 2)]
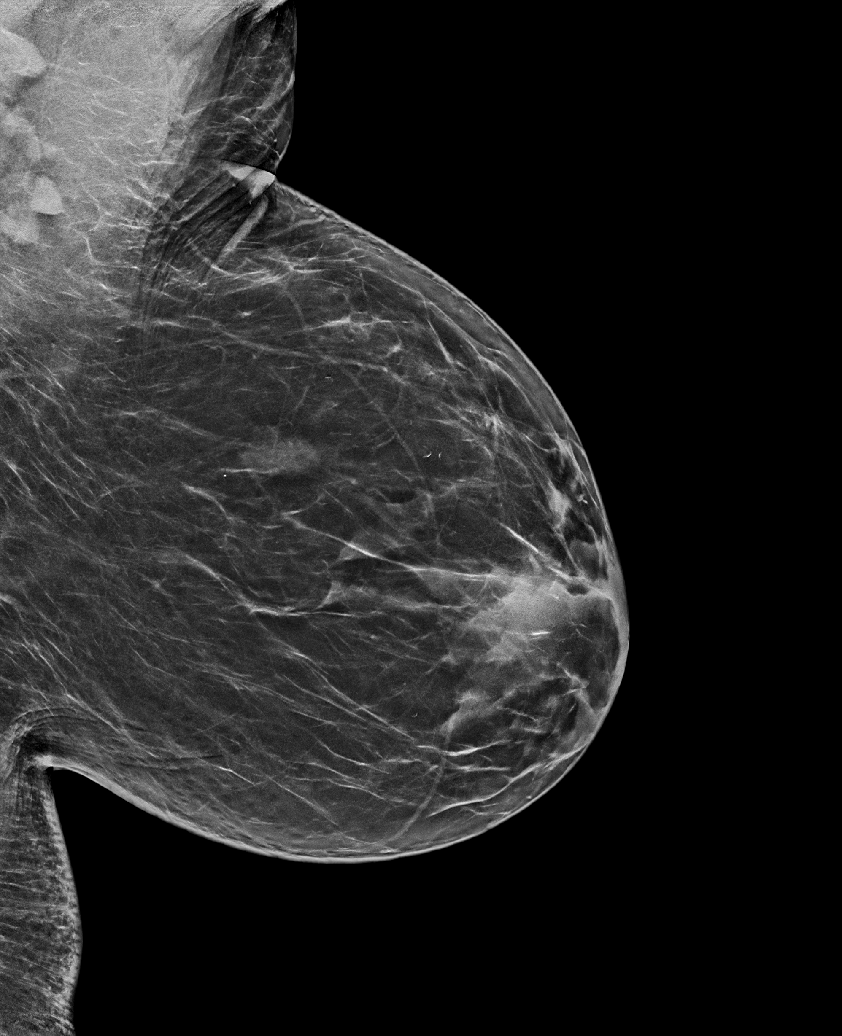

[L MLO synth-2D (2 of 2)]
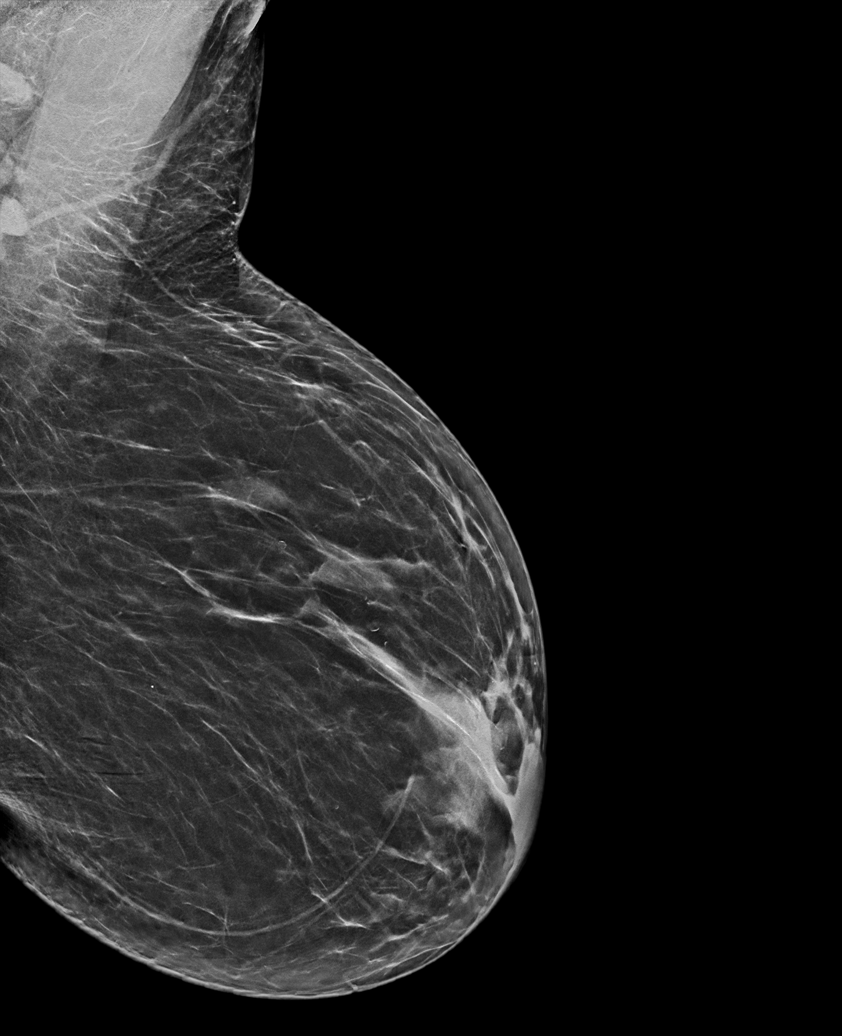

[R CC synth-2D]
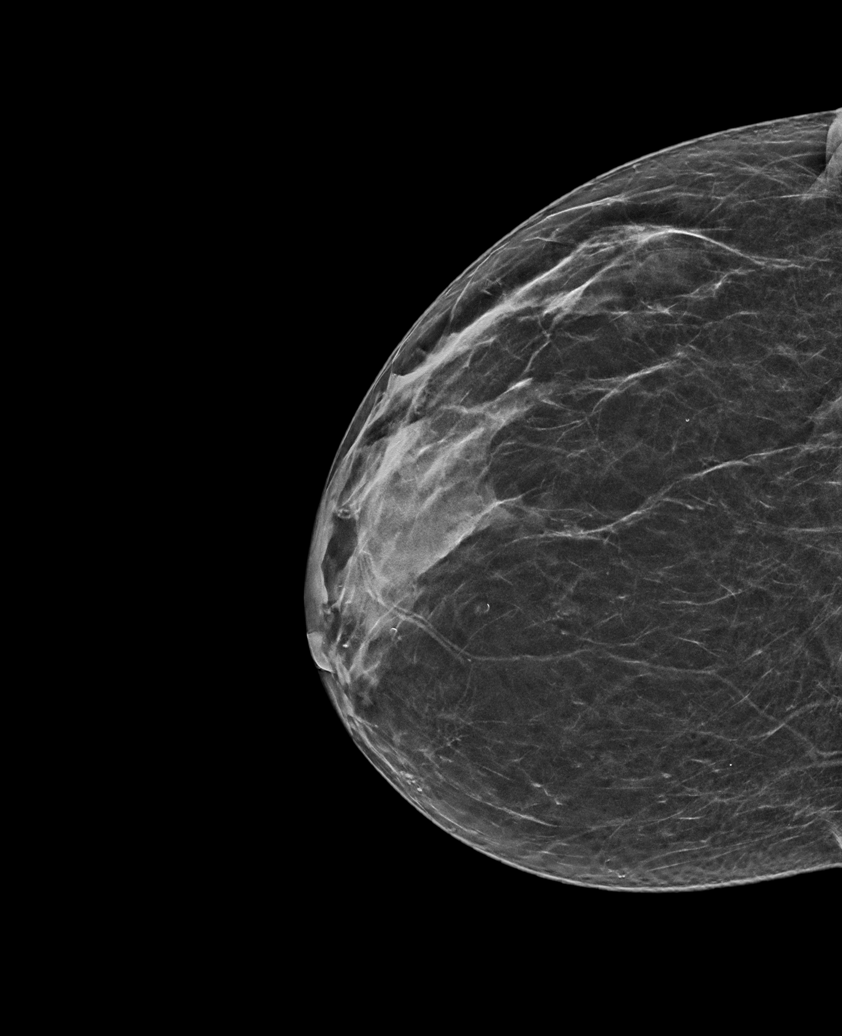

[R MLO synth-2D]
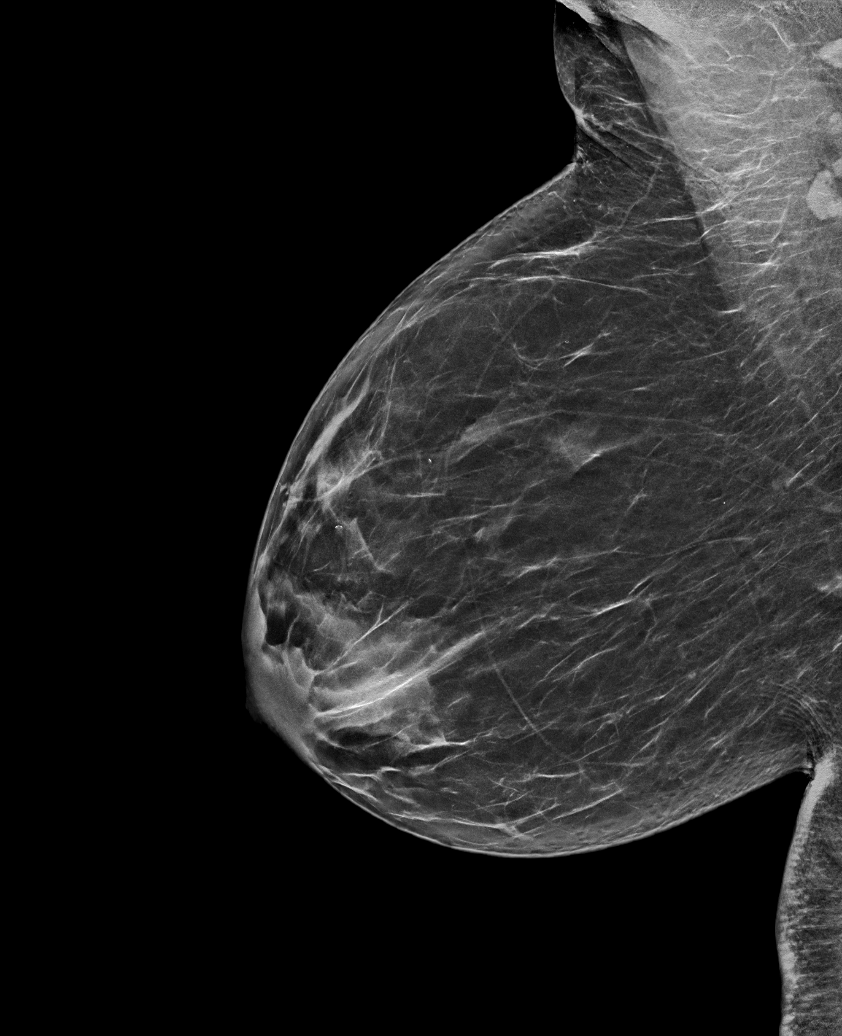

[L CC synth-2D]
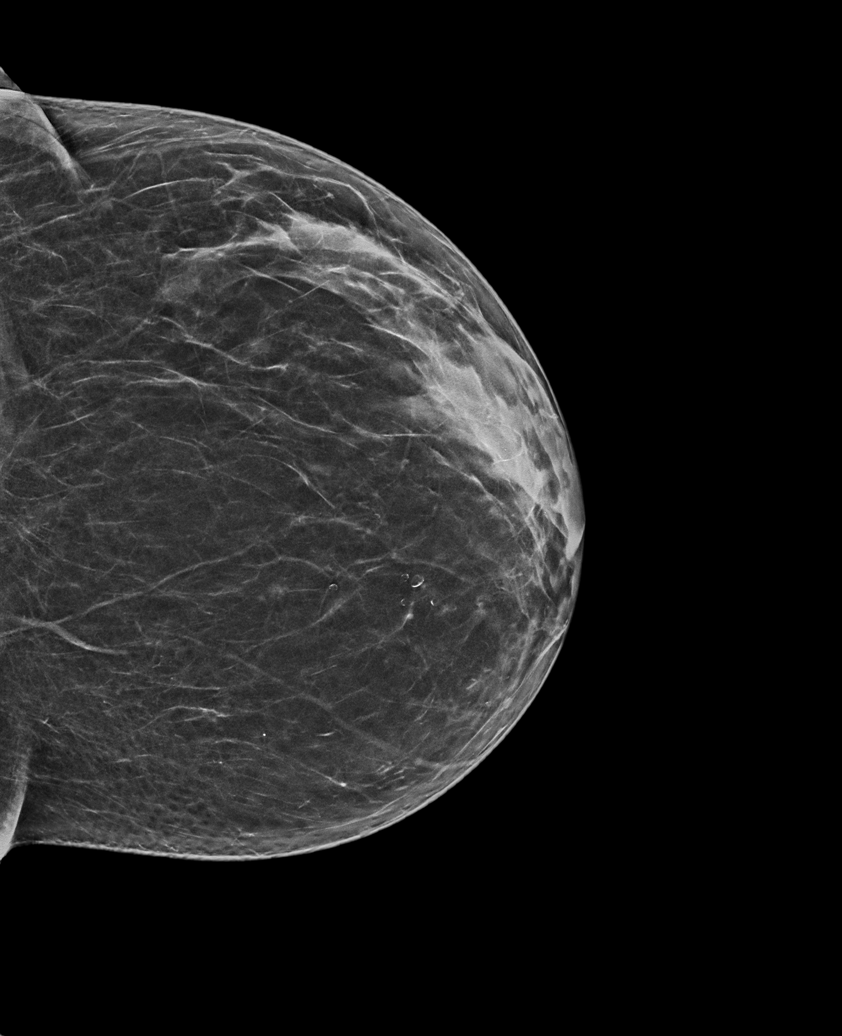

[L MLO tomo · tomo slice 39/78.0]
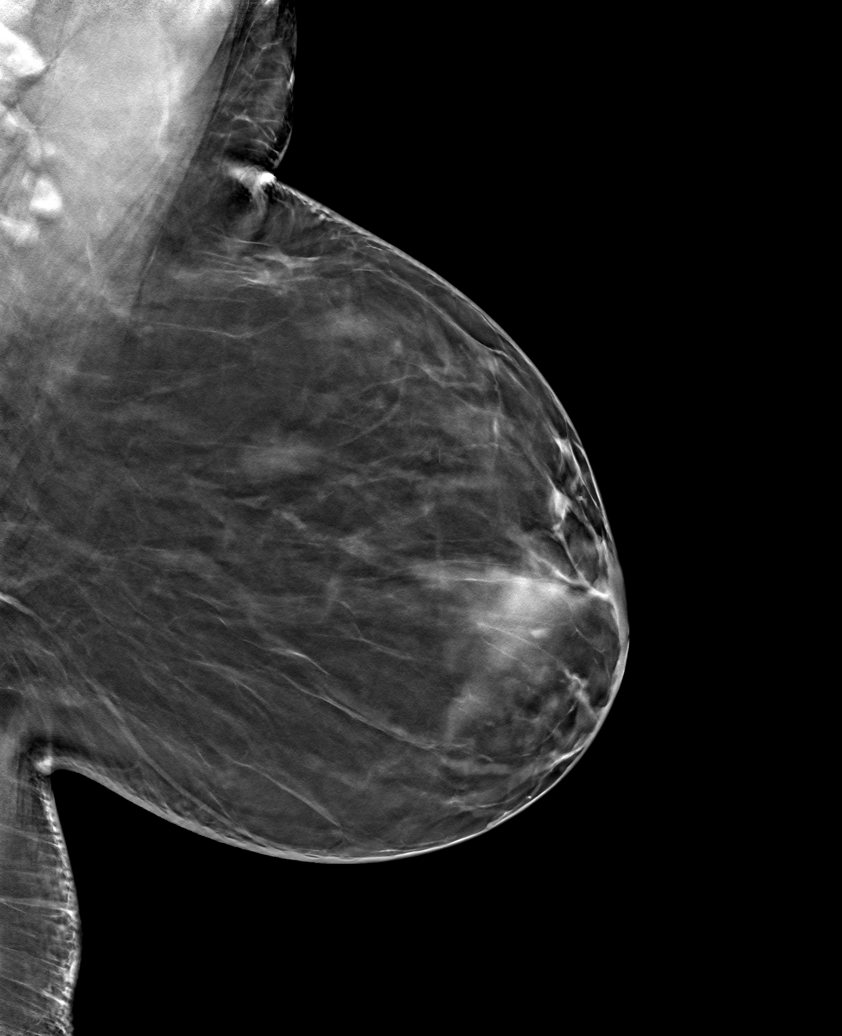

[6 of 30 positions shown; findings below may reference images not displayed]

ACR Breast Density Category b: There are scattered areas of
fibroglandular density.
FINDINGS: There are no findings suspicious for malignancy.
IMPRESSION: No mammographic evidence of malignancy. A result letter of this
screening mammogram will be mailed directly to the patient.

RECOMMENDATION:
Screening mammogram in one year. (Code:51-O-LD2)

BI-RADS CATEGORY  1: Negative.
